# Patient Record
Sex: Female | Born: 1955 | Race: White | Hispanic: No | Marital: Single | Smoking: Never smoker
Health system: Southern US, Community
[De-identification: ages and names within clinical notes are randomized; demographics above are authoritative.]

## PROBLEM LIST (undated history)

## (undated) DIAGNOSIS — F419 Anxiety disorder, unspecified: Secondary | ICD-10-CM

## (undated) DIAGNOSIS — R7303 Prediabetes: Secondary | ICD-10-CM

## (undated) DIAGNOSIS — Z973 Presence of spectacles and contact lenses: Secondary | ICD-10-CM

## (undated) DIAGNOSIS — M1990 Unspecified osteoarthritis, unspecified site: Secondary | ICD-10-CM

## (undated) DIAGNOSIS — I10 Essential (primary) hypertension: Secondary | ICD-10-CM

## (undated) DIAGNOSIS — D649 Anemia, unspecified: Secondary | ICD-10-CM

## (undated) DIAGNOSIS — T7840XA Allergy, unspecified, initial encounter: Secondary | ICD-10-CM

## (undated) DIAGNOSIS — N200 Calculus of kidney: Secondary | ICD-10-CM

## (undated) DIAGNOSIS — Z87442 Personal history of urinary calculi: Secondary | ICD-10-CM

## (undated) DIAGNOSIS — Z862 Personal history of diseases of the blood and blood-forming organs and certain disorders involving the immune mechanism: Secondary | ICD-10-CM

## (undated) DIAGNOSIS — N390 Urinary tract infection, site not specified: Secondary | ICD-10-CM

## (undated) DIAGNOSIS — K529 Noninfective gastroenteritis and colitis, unspecified: Secondary | ICD-10-CM

## (undated) DIAGNOSIS — E785 Hyperlipidemia, unspecified: Secondary | ICD-10-CM

## (undated) HISTORY — DX: Allergy, unspecified, initial encounter: T78.40XA

## (undated) HISTORY — DX: Anxiety disorder, unspecified: F41.9

## (undated) HISTORY — DX: Essential (primary) hypertension: I10

## (undated) HISTORY — DX: Noninfective gastroenteritis and colitis, unspecified: K52.9

## (undated) HISTORY — DX: Anemia, unspecified: D64.9

## (undated) HISTORY — PX: TUBAL LIGATION: SHX77

## (undated) HISTORY — DX: Urinary tract infection, site not specified: N39.0

## (undated) HISTORY — DX: Unspecified osteoarthritis, unspecified site: M19.90

## (undated) HISTORY — PX: COLONOSCOPY: SHX174

## (undated) HISTORY — DX: Calculus of kidney: N20.0

---

## 2005-03-07 DIAGNOSIS — K5010 Crohn's disease of large intestine without complications: Secondary | ICD-10-CM

## 2005-03-07 HISTORY — DX: Crohn's disease of large intestine without complications: K50.10

## 2009-03-30 ENCOUNTER — Ambulatory Visit (HOSPITAL_COMMUNITY): Admission: RE | Admit: 2009-03-30 | Discharge: 2009-03-30 | Payer: Self-pay | Admitting: Urology

## 2011-06-18 ENCOUNTER — Ambulatory Visit: Payer: Self-pay | Admitting: Family Medicine

## 2011-06-18 VITALS — BP 124/80 | HR 67 | Temp 98.9°F | Resp 16 | Ht 64.0 in | Wt 173.6 lb

## 2011-06-18 DIAGNOSIS — M1990 Unspecified osteoarthritis, unspecified site: Secondary | ICD-10-CM

## 2011-06-18 DIAGNOSIS — M159 Polyosteoarthritis, unspecified: Secondary | ICD-10-CM

## 2011-06-18 DIAGNOSIS — I10 Essential (primary) hypertension: Secondary | ICD-10-CM

## 2011-06-18 DIAGNOSIS — F411 Generalized anxiety disorder: Secondary | ICD-10-CM

## 2011-06-18 DIAGNOSIS — F419 Anxiety disorder, unspecified: Secondary | ICD-10-CM

## 2011-06-18 LAB — BASIC METABOLIC PANEL
CO2: 27 mEq/L (ref 19–32)
Glucose, Bld: 103 mg/dL — ABNORMAL HIGH (ref 70–99)
Potassium: 3.9 mEq/L (ref 3.5–5.3)
Sodium: 138 mEq/L (ref 135–145)

## 2011-06-18 MED ORDER — LISINOPRIL 20 MG PO TABS: 20.0000 mg | ORAL_TABLET | Freq: Every day | ORAL | Status: DC

## 2011-06-18 MED ORDER — ATENOLOL 50 MG PO TABS: 50.0000 mg | ORAL_TABLET | Freq: Every day | ORAL | Status: DC

## 2011-06-18 MED ORDER — DIAZEPAM 10 MG PO TABS: 10.0000 mg | ORAL_TABLET | Freq: Four times a day (QID) | ORAL | Status: DC | PRN

## 2011-06-18 MED ORDER — TRAMADOL HCL 50 MG PO TABS: 50.0000 mg | ORAL_TABLET | Freq: Three times a day (TID) | ORAL | Status: DC

## 2011-06-18 NOTE — Progress Notes (Signed)
  Subjective:    Patient ID: Julie Lee, female    DOB: 1955/12/29, 55 y.o.   MRN: [id number]  HPI 56 yo female with HTN and anxiety here for refills. 1) HTN - on atenolol and lisinopril.  Doing well.  Last labs February 2012.  2) anxiety - on diazepam.  Uses a few times a week.   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  IN hands, knees, back.  Tramadol helps immensely.  2 in AM and 1 in PM.    Review of Systems Negative except as per HPI     Objective:   Physical Exam  Constitutional: She appears well-developed and well-nourished.  Cardiovascular: Normal rate, regular rhythm, normal heart sounds and intact distal pulses.   No murmur heard. Pulmonary/Chest: Effort normal and breath sounds normal.  Neurological: She is alert.  Skin: Skin is warm and dry.          Assessment & Plan:  HTN - 1 year supply of meds.  Check BMET here today. Anxiety - refilled valium Arthritis - tramadol refilled.

## 2011-06-19 ENCOUNTER — Encounter: Payer: Self-pay | Admitting: *Deleted

## 2012-01-25 ENCOUNTER — Ambulatory Visit: Payer: Self-pay | Admitting: Family Medicine

## 2012-01-25 VITALS — BP 118/80 | HR 56 | Temp 97.7°F | Resp 16 | Ht 63.5 in | Wt 169.0 lb

## 2012-01-25 DIAGNOSIS — F411 Generalized anxiety disorder: Secondary | ICD-10-CM

## 2012-01-25 DIAGNOSIS — M1990 Unspecified osteoarthritis, unspecified site: Secondary | ICD-10-CM

## 2012-01-25 DIAGNOSIS — I10 Essential (primary) hypertension: Secondary | ICD-10-CM

## 2012-01-25 DIAGNOSIS — F419 Anxiety disorder, unspecified: Secondary | ICD-10-CM

## 2012-01-25 MED ORDER — DIAZEPAM 10 MG PO TABS: 10.0000 mg | ORAL_TABLET | Freq: Two times a day (BID) | ORAL | Status: DC | PRN

## 2012-01-25 MED ORDER — TRAMADOL HCL 50 MG PO TABS: 50.0000 mg | ORAL_TABLET | Freq: Three times a day (TID) | ORAL | Status: DC

## 2012-01-25 MED ORDER — ATENOLOL 50 MG PO TABS: 50.0000 mg | ORAL_TABLET | Freq: Every day | ORAL | Status: DC

## 2012-01-25 MED ORDER — LISINOPRIL 20 MG PO TABS: 20.0000 mg | ORAL_TABLET | Freq: Every day | ORAL | Status: DC

## 2012-01-25 NOTE — Progress Notes (Signed)
[facility] [address], [place] [address] [phone]  Date:  01/25/2012   Name:  Julie Lee   DOB:  Sep 17, 1955   MRN:  [id number]  PCP:  No primary provider on file.    Chief Complaint: Medication Refill   History of Present Illness:  Julie Lee is a 56 y.o. very pleasant female patient who presents with the following:  Here today for a medication refill.   Her BP is well- controlled.   She uses tramadol as needed for OA and aches and pains- it does help.  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  She is very active in her job. Her hands and knees bother her the most.  She takes 2 ultram in the am and 1 in the afternoon  She uses her diazepam for her colitis.  Stress causes her colitis to get worse.  She also uses ibuprofen sometimes for her OA which is upsetting for her stomach.  She takes a 1/2 to a whole pill approx twice a week.  She had more refills from her last rx but they expired after 6 months.    She also notes that her ears feel full and tender.   Declines a flu shot today.   There is no problem list on file for this patient.   Past Medical History  Diagnosis Date  . Arthritis   . Hypertension   . Colitis     Past Surgical History  Procedure Date  . Cesarean section   . Tubal ligation     History  Substance Use Topics  . Smoking status: Never Smoker   . Smokeless tobacco: Never Used  . Alcohol Use: Not on file    History reviewed. No pertinent family history.  No Known Allergies  Medication list has been reviewed and updated.  Current Outpatient Prescriptions on File Prior to Visit  Medication Sig Dispense Refill  . atenolol (TENORMIN) 50 MG tablet Take 1 tablet (50 mg total) by mouth daily.  90 tablet  3  . diazepam (VALIUM) 10 MG tablet Take 1 tablet (10 mg total) by mouth every 6 (six) hours as needed.  30 tablet  5  . lisinopril (PRINIVIL,ZESTRIL) 20 MG tablet Take 1 tablet (20 mg total) by mouth daily.  90 tablet  3    . traMADol (ULTRAM) 50 MG tablet Take 1 tablet (50 mg total) by mouth 3 (three) times daily.  90 tablet  5    Review of Systems:  As per HPI- otherwise negative.   Physical Examination: Filed Vitals:   01/25/12 1519  BP: 118/80  Pulse: 56  Temp: 97.7 F (36.5 C)  Resp: 16   Filed Vitals:   01/25/12 1519  Height: 5' 3.5" (1.613 m)  Weight: 169 lb (76.658 kg)   Body mass index is 29.47 kg/(m^2). Ideal Body Weight: Weight in (lb) to have BMI = 25: 143.1   GEN: WDWN, NAD, Non-toxic, A & O x 3 HEENT: Atraumatic, Normocephalic. Neck supple. No masses, No LAD. Bilateral TM obscured by cerumen, oropharynx normal.  PEERL,EOMI.   Ears and Nose: No external deformity. CV: RRR, No M/G/R. No JVD. No thrill. No extra heart sounds. PULM: CTA B, no wheezes, crackles, rhonchi. No retractions. No resp. distress. No accessory muscle use. EXTR: No c/c/e.  IP joints of both hands clearly show OA changes NEURO Normal gait.  PSYCH: Normally interactive. Conversant. Not depressed or anxious appearing.  Calm demeanor.   Both ears irrigated  with warm water until cerumen removed.  She felt much better and TM normal bilaterally    Assessment and Plan: 1. HTN (hypertension)  atenolol (TENORMIN) 50 MG tablet, lisinopril (PRINIVIL,ZESTRIL) 20 MG tablet  2. OA (osteoarthritis)  traMADol (ULTRAM) 50 MG tablet  3. Anxiety  diazepam (VALIUM) 10 MG tablet   Refilled her medications today as above.  Encouraged her to come in for a CPE when she can- she hopes to do this in the spring. Cerumen impaction resolved.   Meds ordered this encounter  Medications  . atenolol (TENORMIN) 50 MG tablet    Sig: Take 1 tablet (50 mg total) by mouth daily.    Dispense:  90 tablet    Refill:  3  . lisinopril (PRINIVIL,ZESTRIL) 20 MG tablet    Sig: Take 1 tablet (20 mg total) by mouth daily.    Dispense:  90 tablet    Refill:  3  . traMADol (ULTRAM) 50 MG tablet    Sig: Take 1 tablet (50 mg total) by mouth 3  (three) times daily.    Dispense:  90 tablet    Refill:  5  . diazepam (VALIUM) 10 MG tablet    Sig: Take 1 tablet (10 mg total) by mouth every 12 (twelve) hours as needed for anxiety.    Dispense:  30 tablet    Refill:  3     Arthella Headings, MD

## 2012-08-23 ENCOUNTER — Ambulatory Visit (INDEPENDENT_AMBULATORY_CARE_PROVIDER_SITE_OTHER): Payer: BC Managed Care – PPO | Admitting: Family Medicine

## 2012-08-23 VITALS — BP 110/60 | HR 64 | Temp 98.0°F | Resp 18 | Ht 64.5 in | Wt 166.0 lb

## 2012-08-23 DIAGNOSIS — I10 Essential (primary) hypertension: Secondary | ICD-10-CM | POA: Insufficient documentation

## 2012-08-23 DIAGNOSIS — Z23 Encounter for immunization: Secondary | ICD-10-CM

## 2012-08-23 DIAGNOSIS — Z0000 Encounter for general adult medical examination without abnormal findings: Secondary | ICD-10-CM

## 2012-08-23 DIAGNOSIS — M1990 Unspecified osteoarthritis, unspecified site: Secondary | ICD-10-CM | POA: Insufficient documentation

## 2012-08-23 DIAGNOSIS — K5190 Ulcerative colitis, unspecified, without complications: Secondary | ICD-10-CM | POA: Insufficient documentation

## 2012-08-23 LAB — COMPREHENSIVE METABOLIC PANEL
ALT: 19 U/L (ref 0–35)
Albumin: 3.9 g/dL (ref 3.5–5.2)
Alkaline Phosphatase: 75 U/L (ref 39–117)
CO2: 25 mEq/L (ref 19–32)
Glucose, Bld: 90 mg/dL (ref 70–99)
Potassium: 4.1 mEq/L (ref 3.5–5.3)
Sodium: 141 mEq/L (ref 135–145)
Total Protein: 6.6 g/dL (ref 6.0–8.3)

## 2012-08-23 LAB — POCT CBC
Hemoglobin: 12.4 g/dL (ref 12.2–16.2)
Lymph, poc: 2 (ref 0.6–3.4)
MCH, POC: 27.7 pg (ref 27–31.2)
MCHC: 30.6 g/dL — AB (ref 31.8–35.4)
MCV: 90.6 fL (ref 80–97)
MID (cbc): 0.5 (ref 0–0.9)
MPV: 9.4 fL (ref 0–99.8)
POC MID %: 8 %M (ref 0–12)

## 2012-08-23 LAB — LIPID PANEL
Cholesterol: 209 mg/dL — ABNORMAL HIGH (ref 0–200)
HDL: 57 mg/dL (ref >39)
Total CHOL/HDL Ratio: 3.7 Ratio

## 2012-08-23 MED ORDER — TRAMADOL HCL 50 MG PO TABS: 100.0000 mg | ORAL_TABLET | Freq: Two times a day (BID) | ORAL | Status: DC | PRN

## 2012-08-23 MED ORDER — CYCLOBENZAPRINE HCL 10 MG PO TABS: 10.0000 mg | ORAL_TABLET | Freq: Every evening | ORAL | Status: DC | PRN

## 2012-08-23 NOTE — Patient Instructions (Addendum)
Try cutting your atenolol in 1/2- please get in touch with me in the next couple of weeks with an update regarding your blood pressure and how you are feeling I will be in touch regarding your labs We will refer you to GI and also to physical therapy

## 2012-08-23 NOTE — Progress Notes (Signed)
[facility] [address] [place] [address] [phone]  Date:  08/23/2012   Name:  Julie Lee   DOB:  1955-04-13   MRN:  [id number]  PCP:  No primary provider on file.    Chief Complaint: cpe   History of Present Illness:  Julie Lee is a 57 y.o. very pleasant female patient who presents with the following:  She is here today for a CPE.  She continues to have pain with her arthritis, mostly in her hands, feet, right knee and left shoulder.  She uses tramadol as needed- she takes 100 mg BID.  She cannot use NSAIDs due to her colitis.  She has a history of UC and her last colonoscopy was about 10 years ago.   She recently got insurance.  She needs to get established with a GI doctor in [place].    Her last pap was about 8 years ago; she has never had an abnormal pap smear She is also due for a mammogram- she knows how to schedule this.  She did have a FNA in the past, but this turned out to be ok.   She has been menopausal for about 10 years.  She plans to schedule her mammogram at her usual facility and will also inquire about getting a dexa scan.    She ate a small amount this morning, and would like to go ahead and do labs today.  Vanilla wafers with PB.   Her last tetanus shot was about 20 years ago   She is bothered the most by her left shoulder- it has been painful for some time and really hurts at night, sometimes it keeps her awake.  She uses some flexeril last year which was helpful for her.    She uses the valium very rarely and does not need a RF today She is concerned that her BP may be too low, and she sometimes feels dizzy is she stands up too fast.  ?could she stop some of her BP medication. She does not have any other heart problems that she is aware of and does not think there is any particular reason why she is on a beta blocker  There are no active problems to display for this patient.   Past Medical History  Diagnosis Date   . Arthritis   . Hypertension   . Colitis   . Allergy   . Anxiety   . Colitis     Past Surgical History  Procedure Laterality Date  . Cesarean section    . Tubal ligation      History  Substance Use Topics  . Smoking status: Never Smoker   . Smokeless tobacco: Never Used  . Alcohol Use: Not on file    History reviewed. No pertinent family history.  No Known Allergies  Medication list has been reviewed and updated.  Current Outpatient Prescriptions on File Prior to Visit  Medication Sig Dispense Refill  . atenolol (TENORMIN) 50 MG tablet Take 1 tablet (50 mg total) by mouth daily.  90 tablet  3  . diazepam (VALIUM) 10 MG tablet Take 1 tablet (10 mg total) by mouth every 12 (twelve) hours as needed for anxiety.  30 tablet  3  . lisinopril (PRINIVIL,ZESTRIL) 20 MG tablet Take 1 tablet (20 mg total) by mouth daily.  90 tablet  3  . traMADol (ULTRAM) 50 MG tablet Take 1 tablet (50 mg total) by mouth 3 (three) times daily.  90 tablet  5   No current facility-administered medications on file prior to visit.    Review of Systems:  As per HPI- otherwise negative. Reviewed her ROS sheet  Physical Examination: Filed Vitals:   08/23/12 1234  BP: 110/60  Pulse: 64  Temp: 98 F (36.7 C)  Resp: 18   Filed Vitals:   08/23/12 1234  Height: 5' 4.5" (1.638 m)  Weight: 166 lb (75.297 kg)   Body mass index is 28.06 kg/(m^2). Ideal Body Weight: Weight in (lb) to have BMI = 25: 147.6  GEN: WDWN, NAD, Non-toxic, A & O x 3, looks well HEENT: Atraumatic, Normocephalic. Neck supple. No masses, No LAD.  Bilateral TM wnl, oropharynx normal.  PEERL,EOMI.   Ears and Nose: No external deformity. CV: RRR, No M/G/R. No JVD. No thrill. No extra heart sounds. PULM: CTA B, no wheezes, crackles, rhonchi. No retractions. No resp. distress. No accessory muscle use. ABD: S, NT, ND, +BS. No rebound. No HSM. EXTR: No c/c/e.  Evidence of OA of her hands/ DIP joints NEURO Normal gait.  PSYCH:  Normally interactive. Conversant. Not depressed or anxious appearing.  Calm demeanor.  Breast:normal exam, no masses or dimpling GU: normal exam, no adnexal TTP, no discharge, lesions, or CMT Left shoulder: she has tenderness with full flexion, and with internal and external rotation.   She has OA of her hands, especially the DIPs of both hands   Results for orders placed in visit on 08/23/12  POCT CBC      Result Value Range   WBC 6.8  4.6 - 10.2 K/uL   Lymph, poc 2.0  0.6 - 3.4   POC LYMPH PERCENT 29.7  10 - 50 %L   MID (cbc) 0.5  0 - 0.9   POC MID % 8.0  0 - 12 %M   POC Granulocyte 4.2  2 - 6.9   Granulocyte percent 62.3  37 - 80 %G   RBC 4.47  4.04 - 5.48 M/uL   Hemoglobin 12.4  12.2 - 16.2 g/dL   HCT, POC 40.5  37.7 - 47.9 %   MCV 90.6  80 - 97 fL   MCH, POC 27.7  27 - 31.2 pg   MCHC 30.6 (*) 31.8 - 35.4 g/dL   RDW, POC 13.9     Platelet Count, POC 337  142 - 424 K/uL   MPV 9.4  0 - 99.8 fL    EKG: wnl  Assessment and Plan: Physical exam, annual - Plan: POCT CBC, Comprehensive metabolic panel, TSH, Lipid panel, Tdap vaccine greater than or equal to 7yo IM, POCT urinalysis dipstick, POCT UA - Microscopic Only, Pap IG w/ reflex to HPV when ASC-U  Ulcerative colitis, unspecified - Plan: Ambulatory referral to Gastroenterology  Osteoarthritis  HTN (hypertension) - Plan: EKG 12-Lead  OA (osteoarthritis) - Plan: traMADol (ULTRAM) 50 MG tablet, cyclobenzaprine (FLEXERIL) 10 MG tablet, Ambulatory referral to Physical Therapy  Julie Lee is here for a CPE an a few other issues today   See plan above, Will plan further follow- up pending labs. She will cut her atenolol in half and let me know how her BP looks in about 2 weeks  OA pain- referral to PT for her shoulder.  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   Refilled her tramadol- she can take 100 mg BID, and then may try a flexeril at night as needed.  She has used this combination in the past without ill effect.  Cautioned her not to combine the  flexeril with her valium, as this could cause excessive sedation  She will schedule a mammogram and dexa scan  Signed Lamar Blinks, MD

## 2012-08-24 LAB — PAP IG W/ RFLX HPV ASCU

## 2012-08-25 ENCOUNTER — Encounter: Payer: Self-pay | Admitting: Family Medicine

## 2012-10-26 ENCOUNTER — Encounter: Payer: Self-pay | Admitting: Family Medicine

## 2013-01-10 ENCOUNTER — Other Ambulatory Visit: Payer: Self-pay

## 2013-03-15 ENCOUNTER — Other Ambulatory Visit: Payer: Self-pay | Admitting: Family Medicine

## 2013-04-16 ENCOUNTER — Ambulatory Visit (INDEPENDENT_AMBULATORY_CARE_PROVIDER_SITE_OTHER): Payer: BC Managed Care – PPO | Admitting: Emergency Medicine

## 2013-04-16 VITALS — BP 126/78 | HR 84 | Temp 98.1°F | Resp 17 | Ht 64.0 in | Wt 163.0 lb

## 2013-04-16 DIAGNOSIS — F411 Generalized anxiety disorder: Secondary | ICD-10-CM

## 2013-04-16 DIAGNOSIS — M1990 Unspecified osteoarthritis, unspecified site: Secondary | ICD-10-CM

## 2013-04-16 DIAGNOSIS — I10 Essential (primary) hypertension: Secondary | ICD-10-CM

## 2013-04-16 DIAGNOSIS — F419 Anxiety disorder, unspecified: Secondary | ICD-10-CM

## 2013-04-16 DIAGNOSIS — Z760 Encounter for issue of repeat prescription: Secondary | ICD-10-CM

## 2013-04-16 MED ORDER — TRAMADOL HCL 50 MG PO TABS: 100.0000 mg | ORAL_TABLET | Freq: Two times a day (BID) | ORAL | Status: DC | PRN

## 2013-04-16 MED ORDER — CYCLOBENZAPRINE HCL 10 MG PO TABS: 10.0000 mg | ORAL_TABLET | Freq: Every evening | ORAL | Status: DC | PRN

## 2013-04-16 MED ORDER — LISINOPRIL 20 MG PO TABS: ORAL_TABLET | ORAL | Status: DC

## 2013-04-16 MED ORDER — ATENOLOL 50 MG PO TABS: ORAL_TABLET | ORAL | Status: DC

## 2013-04-16 MED ORDER — DIAZEPAM 10 MG PO TABS: 10.0000 mg | ORAL_TABLET | Freq: Two times a day (BID) | ORAL | Status: DC | PRN

## 2013-04-16 NOTE — Progress Notes (Signed)
   Subjective:    Patient ID: Julie Lee, female    DOB: [birth year], 58 y.o.   MRN: [id number]  HPI  58 yo female presents as medication prescriptions are running out.  She is currently taking Valium, atenolol, flexeril, lisinopril and tramadol.  No recent changes to health and no current complaints.  States she is down to last day or two for BP meds.    PPMH:  Osteoarthritis, anxiety, hypertension  SH:  Nonsmoker, no tobacco  Review of Systems  Constitutional: Negative for fever, chills, activity change and appetite change.  Respiratory: Negative for cough, chest tightness and shortness of breath.   Cardiovascular: Negative for chest pain, palpitations and leg swelling.  Gastrointestinal: Negative for nausea, vomiting, abdominal pain, diarrhea and constipation.  Musculoskeletal: Positive for arthralgias. Negative for back pain.  Neurological: Negative for light-headedness and numbness.       Objective:   Physical Exam Blood pressure 126/78, pulse 84, temperature 98.1 F (36.7 C), temperature source Oral, resp. rate 17, height 5' 4"  (1.626 m), weight 163 lb (73.936 kg), SpO2 94.00%. Body mass index is 27.97 kg/(m^2). Well-developed, well nourished female who is awake, alert and oriented, in NAD. HEENT: [place]/AT, PERRL, EOMI.  Sclera and conjunctiva are clear.  Neck: supple, non-tender Heart: RRR, no murmur Lungs: normal effort, CTA Abdomen: normo-active bowel sounds, supple, non-tender, no mass or organomegaly. Extremities: no cyanosis, clubbing or edema. Skin: warm and dry without rash. Psychologic: good mood and appropriate affect, normal speech and behavior.        Assessment & Plan:  Medication refill.  Patient given a prescription for one month of her medications.  She will schedule appointment with Dr. Edilia Bo for further prescriptions after that time.

## 2013-04-19 ENCOUNTER — Other Ambulatory Visit: Payer: Self-pay

## 2013-04-19 DIAGNOSIS — I10 Essential (primary) hypertension: Secondary | ICD-10-CM

## 2013-04-19 MED ORDER — ATENOLOL 50 MG PO TABS: ORAL_TABLET | ORAL | Status: DC

## 2013-05-15 ENCOUNTER — Ambulatory Visit (INDEPENDENT_AMBULATORY_CARE_PROVIDER_SITE_OTHER): Payer: BC Managed Care – PPO | Admitting: Family Medicine

## 2013-05-15 ENCOUNTER — Encounter: Payer: Self-pay | Admitting: Gastroenterology

## 2013-05-15 VITALS — BP 120/80 | HR 59 | Temp 98.3°F | Resp 16 | Ht 64.0 in | Wt 165.0 lb

## 2013-05-15 DIAGNOSIS — I10 Essential (primary) hypertension: Secondary | ICD-10-CM

## 2013-05-15 DIAGNOSIS — F419 Anxiety disorder, unspecified: Secondary | ICD-10-CM

## 2013-05-15 DIAGNOSIS — M1990 Unspecified osteoarthritis, unspecified site: Secondary | ICD-10-CM

## 2013-05-15 DIAGNOSIS — R140 Abdominal distension (gaseous): Secondary | ICD-10-CM

## 2013-05-15 DIAGNOSIS — R142 Eructation: Secondary | ICD-10-CM

## 2013-05-15 DIAGNOSIS — R143 Flatulence: Secondary | ICD-10-CM

## 2013-05-15 DIAGNOSIS — R141 Gas pain: Secondary | ICD-10-CM

## 2013-05-15 DIAGNOSIS — Z8041 Family history of malignant neoplasm of ovary: Secondary | ICD-10-CM

## 2013-05-15 LAB — CBC
HEMATOCRIT: 38 % (ref 36.0–46.0)
Hemoglobin: 12.6 g/dL (ref 12.0–15.0)
MCH: 27.5 pg (ref 26.0–34.0)
MCHC: 33.2 g/dL (ref 30.0–36.0)
MCV: 83 fL (ref 78.0–100.0)
PLATELETS: 352 10*3/uL (ref 150–400)
RBC: 4.58 MIL/uL (ref 3.87–5.11)
RDW: 14.2 % (ref 11.5–15.5)
WBC: 6.6 10*3/uL (ref 4.0–10.5)

## 2013-05-15 LAB — POCT UA - MICROSCOPIC ONLY
Bacteria, U Microscopic: NEGATIVE
CRYSTALS, UR, HPF, POC: NEGATIVE
Casts, Ur, LPF, POC: NEGATIVE
EPITHELIAL CELLS, URINE PER MICROSCOPY: NEGATIVE
MUCUS UA: NEGATIVE
RBC, urine, microscopic: NEGATIVE
WBC, UR, HPF, POC: NEGATIVE
YEAST UA: NEGATIVE

## 2013-05-15 LAB — POCT URINALYSIS DIPSTICK
Bilirubin, UA: NEGATIVE
Glucose, UA: NEGATIVE
KETONES UA: NEGATIVE
Leukocytes, UA: NEGATIVE
Nitrite, UA: NEGATIVE
PROTEIN UA: NEGATIVE
SPEC GRAV UA: 1.02
Urobilinogen, UA: 0.2
pH, UA: 7

## 2013-05-15 MED ORDER — LISINOPRIL 20 MG PO TABS: ORAL_TABLET | ORAL | Status: DC

## 2013-05-15 MED ORDER — DIAZEPAM 10 MG PO TABS: 10.0000 mg | ORAL_TABLET | Freq: Two times a day (BID) | ORAL | Status: DC | PRN

## 2013-05-15 MED ORDER — ATENOLOL 50 MG PO TABS: ORAL_TABLET | ORAL | Status: DC

## 2013-05-15 MED ORDER — TRAMADOL HCL 50 MG PO TABS: 100.0000 mg | ORAL_TABLET | Freq: Two times a day (BID) | ORAL | Status: DC | PRN

## 2013-05-15 NOTE — Patient Instructions (Signed)
I will be in touch with your labs asap- if you have any change in your condition in the next few days please give me a call.

## 2013-05-15 NOTE — Progress Notes (Signed)
[facility] [address], [place] [address] [phone]  Date:  05/15/2013   Name:  SHERIDYN CANINO   DOB:  [birth year]   MRN:  [id number]  PCP:  Lamar Blinks, MD    Chief Complaint: Medication Refill   History of Present Illness:  Julie Lee is a 58 y.o. very pleasant female patient who presents with the following:  She needs a refill of her lisinopril, and would like to have a zostavax rx today.  She works [employer].  She does have UC, but has not had a flare in some time.  Her next colonoscopy is next month.   She has noted some "discomfort" in her lower abdomen for about one year.  She does feel bloated, and notes intermittent pressure and pain in her pelvic area.  The pain can radiate into her back.  She may notice this every day- not as bad on her days off from her job.  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    She has an aunt with a history of ovarian cancer.  She is concerned about this.  She has other family history of ovarian cancer, and some breast cancer in her family.   She had a protein shake this am  She takes lisinopril and atenolol for her HTN.  She does not note any pre- syncope  She has been menopausal for about 10 years.  No bleeding.   She has been stable on her doses of tramadol and valium for some time, uses for anxiety and OA  Patient Active Problem List   Diagnosis Date Noted  . HTN (hypertension) 08/23/2012  . OA (osteoarthritis) 08/23/2012  . Ulcerative colitis, unspecified 08/23/2012    Past Medical History  Diagnosis Date  . Arthritis   . Hypertension   . Colitis   . Allergy   . Anxiety   . Colitis     Past Surgical History  Procedure Laterality Date  . Cesarean section    . Tubal ligation      History  Substance Use Topics  . Smoking status: Never Smoker   . Smokeless tobacco:  Never Used  . Alcohol Use: No    History reviewed. No pertinent family history.  No Known Allergies  Medication list has been reviewed and updated.  Current Outpatient Prescriptions on File Prior to Visit  Medication Sig Dispense Refill  . atenolol (TENORMIN) 50 MG tablet Take 1 tablet by mouth daily.  30 tablet  0  . diazepam (VALIUM) 10 MG tablet Take 1 tablet (10 mg total) by mouth every 12 (twelve) hours as needed for anxiety.  30 tablet  0  . lisinopril (PRINIVIL,ZESTRIL) 20 MG tablet PT NEEDS APPT FOR FURTHER REFILLS  30 tablet  0  . traMADol (ULTRAM) 50 MG tablet Take 2 tablets (100 mg total) by mouth 2 (two) times daily as needed.  120 tablet  0  . cyclobenzaprine (FLEXERIL) 10 MG tablet Take 1 tablet (10 mg total) by mouth at bedtime as needed for muscle spasms.  30 tablet  0   No current facility-administered medications on file prior to visit.    Review of Systems:  As per HPI- otherwise negative.   Physical Examination: Filed Vitals:   05/15/13 1104  BP: 120/80  Pulse: 59  Temp: 98.3 F (36.8 C)  Resp: 16  Filed Vitals:   05/15/13 1104  Height: 5' 4"  (1.626 m)  Weight: 165 lb (74.844 kg)   Body mass index is 28.31 kg/(m^2). Ideal Body Weight: Weight in (lb) to have BMI = 25: 145.3  GEN: WDWN, NAD, Non-toxic, A & O x 3, mild overweight, looks well HEENT: Atraumatic, Normocephalic. Neck supple. No masses, No LAD.  Bilateral TM wnl, oropharynx normal.  PEERL,EOMI.   Ears and Nose: No external deformity. CV: RRR, No M/G/R. No JVD. No thrill. No extra heart sounds. PULM: CTA B, no wheezes, crackles, rhonchi. No retractions. No resp. distress. No accessory muscle use. ABD: S, NT, ND, +BS. No rebound. No HSM.  Benign exam EXTR: No c/c/e NEURO Normal gait.  PSYCH: Normally interactive. Conversant. Not depressed or anxious appearing.  Calm demeanor.  GU:  Normal exam, no adnexal masses or tenderness, no uterine enlargement,  No CMT, no vaginal discharge or  lesions  Results for orders placed in visit on 05/15/13  POCT UA - MICROSCOPIC ONLY      Result Value Ref Range   WBC, Ur, HPF, POC neg     RBC, urine, microscopic neg     Bacteria, U Microscopic neg     Mucus, UA neg     Epithelial cells, urine per micros neg     Crystals, Ur, HPF, POC neg     Casts, Ur, LPF, POC neg     Yeast, UA neg    POCT URINALYSIS DIPSTICK      Result Value Ref Range   Color, UA yellow     Clarity, UA clear     Glucose, UA neg     Bilirubin, UA neg     Ketones, UA neg     Spec Grav, UA 1.020     Blood, UA small     pH, UA 7.0     Protein, UA neg     Urobilinogen, UA 0.2     Nitrite, UA neg     Leukocytes, UA Negative      Assessment and Plan: Essential hypertension, benign - Plan: Comprehensive metabolic panel, lisinopril (PRINIVIL,ZESTRIL) 20 MG tablet, atenolol (TENORMIN) 50 MG tablet  Family history of ovarian cancer - Plan: CA 125, US Pelvis Complete  Abdominal bloating - Plan: CBC, Comprehensive metabolic panel, TSH, POCT UA - Microscopic Only, POCT urinalysis dipstick, US Pelvis Complete  OA (osteoarthritis) - Plan: traMADol (ULTRAM) 50 MG tablet  Anxiety - Plan: diazepam (VALIUM) 10 MG tablet  Angelynn is concerned about the possibility of ovarian cancer.  Will check a Ca-125 and plan for a pelvic ultrasound BP is controlled, continue medications Refilled her tramadol and valium as well Please call pt with results- her computer is down. Also question her further about "pink urine.'  Looks ok today but if frank blood will need to se urology  Signed Lamar Blinks, MD

## 2013-05-16 ENCOUNTER — Other Ambulatory Visit: Payer: Self-pay | Admitting: Family Medicine

## 2013-05-16 DIAGNOSIS — Z8041 Family history of malignant neoplasm of ovary: Secondary | ICD-10-CM

## 2013-05-16 DIAGNOSIS — R140 Abdominal distension (gaseous): Secondary | ICD-10-CM

## 2013-05-16 LAB — COMPREHENSIVE METABOLIC PANEL
ALBUMIN: 4.2 g/dL (ref 3.5–5.2)
ALT: 21 U/L (ref 0–35)
AST: 25 U/L (ref 0–37)
Alkaline Phosphatase: 66 U/L (ref 39–117)
BILIRUBIN TOTAL: 0.3 mg/dL (ref 0.2–1.2)
BUN: 12 mg/dL (ref 6–23)
CALCIUM: 9.4 mg/dL (ref 8.4–10.5)
CHLORIDE: 106 meq/L (ref 96–112)
CO2: 27 meq/L (ref 19–32)
Creat: 0.68 mg/dL (ref 0.50–1.10)
GLUCOSE: 90 mg/dL (ref 70–99)
Potassium: 4.7 mEq/L (ref 3.5–5.3)
SODIUM: 140 meq/L (ref 135–145)
TOTAL PROTEIN: 6.6 g/dL (ref 6.0–8.3)

## 2013-05-16 LAB — CA 125: CA 125: 6.8 U/mL (ref 0.0–30.2)

## 2013-05-16 LAB — TSH: TSH: 0.91 u[IU]/mL (ref 0.350–4.500)

## 2013-05-17 ENCOUNTER — Telehealth: Payer: Self-pay

## 2013-05-17 NOTE — Telephone Encounter (Signed)
PT WANTED TO GET IN TOUCH WITH DR. Edilia Bo REGARDING HER BLOOD TEST RESULTS

## 2013-05-19 ENCOUNTER — Telehealth: Payer: Self-pay

## 2013-05-19 DIAGNOSIS — Z780 Asymptomatic menopausal state: Secondary | ICD-10-CM

## 2013-05-19 NOTE — Telephone Encounter (Signed)
Pt notified of labs. She doesn't have access to MyChart. Pt is scheduled for her U/S tomorrow, her mammogram and her bone density, but she needs an order for her bone density sent to [facility] please. Thanks.

## 2013-05-19 NOTE — Telephone Encounter (Signed)
THIS IS PATIENTS 2ND CALL ABOUT HER LAB RESULTS PLEASE CALL HER AT [phone]

## 2013-05-19 NOTE — Telephone Encounter (Signed)
Placed order in [place] and also will fax to [facility].

## 2013-05-20 ENCOUNTER — Ambulatory Visit
Admission: RE | Admit: 2013-05-20 | Discharge: 2013-05-20 | Disposition: A | Discharge status: Home / Self Care | Payer: BC Managed Care – PPO | Source: Ambulatory Visit | Attending: Family Medicine | Admitting: Family Medicine

## 2013-05-20 ENCOUNTER — Encounter: Payer: Self-pay | Admitting: Family Medicine

## 2013-05-20 ENCOUNTER — Telehealth: Payer: Self-pay | Admitting: Family Medicine

## 2013-05-20 DIAGNOSIS — Z8041 Family history of malignant neoplasm of ovary: Secondary | ICD-10-CM

## 2013-05-20 DIAGNOSIS — R319 Hematuria, unspecified: Secondary | ICD-10-CM

## 2013-05-20 DIAGNOSIS — R140 Abdominal distension (gaseous): Secondary | ICD-10-CM

## 2013-05-20 NOTE — Telephone Encounter (Signed)
Sent to the wrong pool.

## 2013-05-20 NOTE — Telephone Encounter (Signed)
Order has been sent, she is good to be seen according to [facility].

## 2013-05-20 NOTE — Telephone Encounter (Signed)
Called to let her know that her ultrasound is normal.  Between this and her negative Ca-125 we are no longer as concerned about ovarian cancer.  She is pleased but still concerned about her sx.  She also admits that she has noted a pink color of her urine, both with wiping and in the toilet bowel.  Will refer to urology next for further evaluation.

## 2013-05-20 NOTE — Telephone Encounter (Signed)
Left message on VM to call back

## 2013-05-20 NOTE — Telephone Encounter (Signed)
Spoke to pt today, she is aware of lab results. She also need to have a bone density referral made bc she has an appointment today at 2:00 pm. Please advise.

## 2013-05-27 ENCOUNTER — Encounter: Payer: Self-pay | Admitting: Family Medicine

## 2013-05-31 ENCOUNTER — Telehealth: Payer: Self-pay

## 2013-05-31 NOTE — Telephone Encounter (Signed)
Pt states that she recently had a bone density test done , the results concluded that she did have low bone mass but states that she does not have osteoporosis. Pt is confused because she thought that osteoporosis is associated with low bone mass. Best# [phone]

## 2013-06-01 ENCOUNTER — Encounter: Payer: Self-pay | Admitting: Family Medicine

## 2013-06-26 ENCOUNTER — Encounter: Payer: Self-pay | Admitting: Gastroenterology

## 2013-06-26 ENCOUNTER — Ambulatory Visit (INDEPENDENT_AMBULATORY_CARE_PROVIDER_SITE_OTHER): Payer: BC Managed Care – PPO | Admitting: Gastroenterology

## 2013-06-26 VITALS — BP 138/80 | HR 56 | Ht 64.0 in | Wt 169.0 lb

## 2013-06-26 DIAGNOSIS — R1314 Dysphagia, pharyngoesophageal phase: Secondary | ICD-10-CM

## 2013-06-26 DIAGNOSIS — K5190 Ulcerative colitis, unspecified, without complications: Secondary | ICD-10-CM

## 2013-06-26 MED ORDER — MOVIPREP 100 G PO SOLR: 1.0000 | Freq: Once | ORAL | Status: DC

## 2013-06-26 NOTE — Patient Instructions (Addendum)
We will get records sent from your previous gastroenterologist for review ([facility]).  This will include any endoscopic (colonoscopy or upper endoscopy) procedures and any associated pathology reports.   You will be set up for a colonoscopy for abd pain, diarrhea. You will be set up for an upper endoscopy for dysphagia.

## 2013-06-26 NOTE — Progress Notes (Signed)
HPI: This is a    very pleasant 58 year old woman whom I am meeting for the first time today.  Cbc, cmet, tsh 05/2013 were all normal  She was told she had colitis by GI doc about 10 years in [place], no polyps.  She was treated with mesalamine product.  She was having diarrhea, bleeding.  She has been getting colazol from PCP.  She takes it about every couple months for pain in left side and diarrhea.  She is seeing a urologist next monht for hematuria.  She's been having 2 months of lower abd pains.  She's also noticed increase frequency of BMs, 3-4 times per day.  No bleeding.  Even nocturnal symptoms.  She took colazol briefly.  Overall stable weight.  Also having swallowing difficulty, dysphagia solids. Happens with every meal.  Will regurgitate it.  Rarely liquids.   Occurs 4-5 times per week at least.  For 5 years.  A friend of her suggested PPI and it made no difference.  No pyrosis.  Review of systems: Pertinent positive and negative review of systems were noted in the above HPI section. Complete review of systems was performed and was otherwise normal.    Past Medical History  Diagnosis Date  . Arthritis   . Hypertension   . Allergy   . Anxiety   . Colitis   . Kidney stones   . UTI (lower urinary tract infection)     Past Surgical History  Procedure Laterality Date  . Cesarean section    . Tubal ligation      Current Outpatient Prescriptions  Medication Sig Dispense Refill  . atenolol (TENORMIN) 50 MG tablet Take 1 tablet by mouth daily.  90 tablet  3  . diazepam (VALIUM) 10 MG tablet Take 1 tablet (10 mg total) by mouth every 12 (twelve) hours as needed for anxiety.  30 tablet  3  . lisinopril (PRINIVIL,ZESTRIL) 20 MG tablet 1 po daily for high blood pressure  90 tablet  3  . traMADol (ULTRAM) 50 MG tablet Take 2 tablets (100 mg total) by mouth 2 (two) times daily as needed.  120 tablet  3   No current facility-administered medications for this visit.     Allergies as of 06/26/2013  . (No Known Allergies)    Family History  Problem Relation Age of Onset  . Colon cancer Maternal Uncle 49  . Colitis Brother     had colectomy  . Diabetes Paternal Uncle   . Diabetes Paternal Grandfather   . Kidney disease Paternal Uncle   . Heart disease Father   . Heart disease Paternal Grandfather   . Heart disease Maternal Grandfather     History   Social History  . Marital Status: Single    Spouse Name: N/A    Number of Children: N/A  . Years of Education: N/A   Occupational History  . Not on file.   Social History Main Topics  . Smoking status: Never Smoker   . Smokeless tobacco: Never Used  . Alcohol Use: Yes     Comment: rarely  . Drug Use: No  . Sexual Activity: No   Other Topics Concern  . Not on file   Social History Narrative  . No narrative on file       Physical Exam: BP 138/80  Pulse 56  Ht 5' 4"  (1.626 m)  Wt 169 lb (76.658 kg)  BMI 28.99 kg/m2 Constitutional: generally well-appearing Psychiatric: alert and oriented x3 Eyes: extraocular movements  intact Mouth: oral pharynx moist, no lesions Neck: supple no lymphadenopathy Cardiovascular: heart regular rate and rhythm Lungs: clear to auscultation bilaterally Abdomen: soft, nontender, nondistended, no obvious ascites, no peritoneal signs, normal bowel sounds Extremities: no lower extremity edema bilaterally Skin: no lesions on visible extremities    Assessment and plan: 58 y.o. female with  history of some type of colitis but does sound like she has ulcerative colitis, five-year history of intermittent dysphasia  We'll try to get records from her previous gastroenterologist for review. It does sound like she probably had and may still have ulcerative colitis. This could account for some of her lower abdominal symptoms, diarrhea flares periodically. She is only taking mesalamine provided by her primary care physician on an as-needed basis about every 2  or 3 months. Her last colonoscopy was over 10 years ago and now like to repeat that now for restaging of her disease, also screening for colon cancer. With her intermittent dysphasia like to at the same time proceed with EGD, dilation if needed. She has tried antiacid medicines in the past including proton pump inhibitors for the dysphasia and they did not help. She has no heartburn symptoms.

## 2013-07-02 ENCOUNTER — Telehealth: Payer: Self-pay | Admitting: Gastroenterology

## 2013-07-02 MED ORDER — MESALAMINE 1.2 G PO TBEC: 2.4000 g | DELAYED_RELEASE_TABLET | Freq: Two times a day (BID) | ORAL | Status: DC

## 2013-07-02 NOTE — Telephone Encounter (Signed)
Colonoscopy, June 2007, Dr. Richardson Landry fine, done for diarrhea, rectal bleeding, occult GI bleeding. Findings diverticulosis of the sigmoid, ulcers in the transverse colon descending colon and cecum which were described as multiple shallow nonbleeding ulcers ranging in size from 2-4 millimeters associated with mucosal friability, normal terminal ileum, internal hemorrhoids. Biopsies suggested that she had mild, chronic, active colitis this was in transverse, cecum and descending segments Upper endoscopy, June 2007, Dr. Richardson Landry fine, done for abdominal pain, nausea vomiting, dysphagia, diarrhea. Findings scattered erosions in the antrum, normal otherwise. She was recommended to stop diclofenac. 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 Biopsies of the duodenum were normal.   Please call her, I reviewed her previous records from [place]. It does appear that she had pretty well documented colitis that is likely ulcerative colitis. I would like her to start lialda 2 pills twice daily.  Please prescribed one month with 11 refills.  We will discuss further at her upcoming colonoscopy, upper endoscopy appointment.   Her upper endoscopy biopsies also suggested that C. she had a process of eosinophilic esophagitis. I am interested to see how her esophagus looks are upcoming upper endoscopy and will repeat biopsies.

## 2013-07-02 NOTE — Telephone Encounter (Signed)
Pt aware and lialda has bee sent to the pharmacy

## 2013-07-03 ENCOUNTER — Encounter: Payer: Self-pay | Admitting: Gastroenterology

## 2013-07-17 ENCOUNTER — Encounter: Payer: Self-pay | Admitting: Family Medicine

## 2013-08-09 ENCOUNTER — Ambulatory Visit (AMBULATORY_SURGERY_CENTER): Payer: BC Managed Care – PPO | Admitting: Gastroenterology

## 2013-08-09 ENCOUNTER — Telehealth: Payer: Self-pay | Admitting: Gastroenterology

## 2013-08-09 ENCOUNTER — Encounter: Payer: Self-pay | Admitting: Gastroenterology

## 2013-08-09 VITALS — BP 126/76 | HR 67 | Temp 97.8°F | Resp 19 | Ht 64.0 in | Wt 169.0 lb

## 2013-08-09 DIAGNOSIS — K5190 Ulcerative colitis, unspecified, without complications: Secondary | ICD-10-CM

## 2013-08-09 DIAGNOSIS — K5289 Other specified noninfective gastroenteritis and colitis: Secondary | ICD-10-CM

## 2013-08-09 DIAGNOSIS — R1314 Dysphagia, pharyngoesophageal phase: Secondary | ICD-10-CM

## 2013-08-09 DIAGNOSIS — R933 Abnormal findings on diagnostic imaging of other parts of digestive tract: Secondary | ICD-10-CM

## 2013-08-09 DIAGNOSIS — K209 Esophagitis, unspecified without bleeding: Secondary | ICD-10-CM

## 2013-08-09 MED ORDER — SODIUM CHLORIDE 0.9 % IV SOLN: 500.0000 mL | INTRAVENOUS | Status: DC

## 2013-08-09 NOTE — Op Note (Signed)
[place]  [address] [place], [id number]   ENDOSCOPY PROCEDURE REPORT  PATIENT: Julie Lee, Julie Lee  MR#: [id number] BIRTHDATE: 1955-05-22 , 58  yrs. old GENDER: Female ENDOSCOPIST: Milus Banister, MD PROCEDURE DATE:  08/09/2013 PROCEDURE:  EGD w/ biopsy ASA CLASS:     Class II INDICATIONS:  Upper endoscopy, June 2007, Dr.  Richardson Landry fine, done for abdominal pain, nausea vomiting, dysphagia, diarrhea.  Findings scattered erosions in the antrum, normal otherwise.  She was recommended to stop diclofenac.  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  Biopsies of the duodenum were normal.  Her symptoms seemed to improve on PPI; Now with recurrent dysphagia MEDICATIONS: MAC sedation, administered by CRNA and propofol (Diprivan) 115m IV TOPICAL ANESTHETIC: none  DESCRIPTION OF PROCEDURE: After the risks benefits and alternatives of the procedure were thoroughly explained, informed consent was obtained.  The LB [id number] was introduced through the mouth and advanced to the second portion of the duodenum. Without limitations.  The instrument was slowly withdrawn as the mucosa was fully examined.     The upper, middle and distal third of the esophagus were carefully inspected and no abnormalities were noted.  The z-line was well seen at the GEJ.  The endoscope was pushed into the fundus which was normal including a retroflexed view.  The antrum, gastric body, first and second part of the duodenum were unremarkable. Retroflexed views revealed no abnormalities.   The esophagus was biopsied (distal and proximal specimine sent in separate jars). The scope was then withdrawn from the patient and the procedure completed.  COMPLICATIONS: There were no complications. ENDOSCOPIC IMPRESSION: Normal EGD, biopsies taken to check for Eosinophilic Esophagitis  RECOMMENDATIONS: Await biopsy results   eSigned:  DMilus Banister MD 08/09/2013 10:43 AM   CC: JLamar Blinks MD

## 2013-08-09 NOTE — Progress Notes (Signed)
A/ox3, pleased with MAC, report to RN 

## 2013-08-09 NOTE — Progress Notes (Signed)
Pt saw some blood on pad and wipes when getting dressed, blood was very small amount smear on pad and pink tinged on wipes, advised pt she could see some blood today but to call us if bleeding gets worse, pt states understanding-adm

## 2013-08-09 NOTE — Progress Notes (Signed)
Called to room to assist during endoscopic procedure.  Patient ID and intended procedure confirmed with present staff. Received instructions for my participation in the procedure from the performing physician.  

## 2013-08-09 NOTE — Op Note (Signed)
[place]  [address]. [place], [address]   COLONOSCOPY PROCEDURE REPORT  PATIENT: Julie Lee, Julie Lee  MR#: [id number] BIRTHDATE: 12/25/1955 , 58  yrs. old GENDER: Female ENDOSCOPIST: Milus Banister, MD REFERRED AS:[id number] Copland, M.D. PROCEDURE DATE:  08/09/2013 PROCEDURE:   Colonoscopy with biopsy First Screening Colonoscopy - Avg.  risk and is 50 yrs.  old or older - No.  Prior Negative Screening - Now for repeat screening. N/A  History of Adenoma - Now for follow-up colonoscopy & has been > or = to 3 yrs.  N/A  Polyps Removed Today? No.  Recommend repeat exam, <10 yrs? No. ASA CLASS:   Class II INDICATIONS:Colonoscopy, June 2007, Dr.  Richardson Landry fine, done for diarrhea, rectal bleeding, occult GI bleeding.  Findings diverticulosis of the sigmoid, ulcers in the transverse colon descending colon and cecum which were described as multiple shallow nonbleeding ulcers ranging in size from 2-4 millimeters associated with mucosal friability, normal terminal ileum, internal hemorrhoids.  Biopsies suggested that she had mild, chronic, active colitis this was in transverse, cecum and descending segments. Now with intermittent diarrhea, abdominal pains. MEDICATIONS: MAC sedation, administered by CRNA and propofol (Diprivan) 320m IV  DESCRIPTION OF PROCEDURE:   After the risks benefits and alternatives of the procedure were thoroughly explained, informed consent was obtained.  A digital rectal exam revealed no abnormalities of the rectum.   The LB [id number] endoscope was introduced through the anus and advanced to the terminal ileum which was intubated for a short distance. No adverse events experienced.   The quality of the prep was good.  The instrument was then slowly withdrawn as the colon was fully examined.  COLON FINDINGS: The mucosa appeared normal in the terminal ileum. There was mild erythema, ? inflammation in ascending segment.  The examination  was otherwise normal.  Segmental biopsies taken (4 separate path jars) and sent to pathology.  Retroflexed views revealed no abnormalities. The time to cecum=2 minutes 21 seconds. Withdrawal time=8 minutes 03 seconds.  The scope was withdrawn and the procedure completed. COMPLICATIONS: There were no complications.  ENDOSCOPIC IMPRESSION: 1.   Normal mucosa in the terminal ileum 2.  There was mild erythema, ? inflammation in ascending segment. The examination was otherwise normal.  Segmental biopsies taken (4 separate path jars) and sent to pathology.  RECOMMENDATIONS: Await final pathology.  For now, continue lialda 2 pills twice daily.   eSigned:  DMilus Banister MD 08/09/2013 10:35 AM    PATIENT NAME:  Julie Lee, MaidenMR#: [id number]

## 2013-08-09 NOTE — Patient Instructions (Signed)
YOU HAD AN ENDOSCOPIC PROCEDURE TODAY AT [facility]: Refer to the procedure report that was given to you for any specific questions about what was found during the examination.  If the procedure report does not answer your questions, please call your gastroenterologist to clarify.  If you requested that your care partner not be given the details of your procedure findings, then the procedure report has been included in a sealed envelope for you to review at your convenience later.  YOU SHOULD EXPECT: Some feelings of bloating in the abdomen. Passage of more gas than usual.  Walking can help get rid of the air that was put into your GI tract during the procedure and reduce the bloating. If you had a lower endoscopy (such as a colonoscopy or flexible sigmoidoscopy) you may notice spotting of blood in your stool or on the toilet paper. If you underwent a bowel prep for your procedure, then you may not have a normal bowel movement for a few days.  DIET: Your first meal following the procedure should be a light meal and then it is ok to progress to your normal diet.  A half-sandwich or bowl of soup is an example of a good first meal.  Heavy or fried foods are harder to digest and may make you feel nauseous or bloated.  Likewise meals heavy in dairy and vegetables can cause extra gas to form and this can also increase the bloating.  Drink plenty of fluids but you should avoid alcoholic beverages for 24 hours.  ACTIVITY: Your care partner should take you home directly after the procedure.  You should plan to take it easy, moving slowly for the rest of the day.  You can resume normal activity the day after the procedure however you should NOT DRIVE or use heavy machinery for 24 hours (because of the sedation medicines used during the test).    SYMPTOMS TO REPORT IMMEDIATELY: A gastroenterologist can be reached at any hour.  During normal business hours, 8:30 AM to 5:00 PM Monday through Friday,  call [phone].  After hours and on weekends, please call the GI answering service at [phone] who will take a message and have the physician on call contact you.   Following lower endoscopy (colonoscopy or flexible sigmoidoscopy):  Excessive amounts of blood in the stool  Significant tenderness or worsening of abdominal pains  Swelling of the abdomen that is new, acute  Fever of 100F or higher  Following upper endoscopy (EGD)  Vomiting of blood or coffee ground material  New chest pain or pain under the shoulder blades  Painful or persistently difficult swallowing  New shortness of breath  Fever of 100F or higher  Black, tarry-looking stools  FOLLOW UP: If any biopsies were taken you will be contacted by phone or by letter within the next 1-3 weeks.  Call your gastroenterologist if you have not heard about the biopsies in 3 weeks.  Our staff will call the home number listed on your records the next business day following your procedure to check on you and address any questions or concerns that you may have at that time regarding the information given to you following your procedure. This is a courtesy call and so if there is no answer at the home number and we have not heard from you through the emergency physician on call, we will assume that you have returned to your regular daily activities without incident.  SIGNATURES/CONFIDENTIALITY: You and/or your care  partner have signed paperwork which will be entered into your electronic medical record.  These signatures attest to the fact that that the information above on your After Visit Summary has been reviewed and is understood.  Full responsibility of the confidentiality of this discharge information lies with you and/or your care-partner.  Take 2 liada twice a day.  Wait biopsy results.  Esophagitis handout give.

## 2013-08-09 NOTE — Telephone Encounter (Signed)
Returned phone call to the pt.  She said she had a small amount of rectal bleeding before she left [facility] today.  Through the afternoon she has passed clots from the rectum the circumference of a quarter and flat, then the size of a flat golf ball.  She asked if she should hold ASA 81 mg and omega 3 krill oil tonight.  I asked Dr. Ardis Hughs what he recommended.  He said that it is unlikely a serious bleed.  That the mucosal bx  was rarely a problem.  He expects the bleeding to lessen and lessen but if the bleeding continues and if it worsens to go to the ED.  I called the pt back and gave her Dr. Eugenia Pancoast message.  She said the clots were lessening and felt the bleeding was better.  I also advised the pt per Dr. Ardis Hughs to hold ASA 81 mg for 4-5 nights but could continue taking omega 3 krill oil.  I also advised the pt to take it easy the rest of the night.  Pt states she understands.  I advised her that Dr. Delfin Edis was our on call md this pm and reminded her the answering service number on her discharge instructions.  Pt thanked me for the advice. maw

## 2013-08-12 ENCOUNTER — Telehealth: Payer: Self-pay

## 2013-08-12 NOTE — Telephone Encounter (Signed)
  Follow up Call-  Call back number 08/09/2013  Post procedure Call Back phone  # [phone]  Permission to leave phone message Yes     Patient questions:  Do you have a fever, pain , or abdominal swelling? no Pain Score  0 *  Have you tolerated food without any problems? yes  Have you been able to return to your normal activities? yes  Do you have any questions about your discharge instructions: Diet   no Medications  no Follow up visit  no  Do you have questions or concerns about your Care? no  Actions: * If pain score is 4 or above: No action needed, pain <4.  The pt called back Friday afternoon and c/o passing clots of blood from her rectum.  She said she did pass a few more clots Friday night but sat. Am she had a normal bowel movement and no bleeding noted.  She did not see any more rectal bleeding.  No other problems noted. Maw

## 2013-08-16 ENCOUNTER — Other Ambulatory Visit: Payer: Self-pay

## 2013-08-16 MED ORDER — FLUTICASONE PROPIONATE HFA 220 MCG/ACT IN AERO: INHALATION_SPRAY | RESPIRATORY_TRACT | Status: DC

## 2013-09-02 ENCOUNTER — Telehealth: Payer: Self-pay | Admitting: Gastroenterology

## 2013-09-04 MED ORDER — PREDNISONE 5 MG PO TABS: 5.0000 mg | ORAL_TABLET | Freq: Every day | ORAL | Status: DC

## 2013-09-04 NOTE — Telephone Encounter (Signed)
The pt says she has not picked up her lialda or fluticasone because she can not afford $50 copay I am looking into patient assistance for her.  She want to know if it is anything else she can try?

## 2013-09-04 NOTE — Telephone Encounter (Signed)
$  89 co pay is actually pretty good for both of those medicines.    Can try prednisone in place of the fluticasone.  Prednisone 23m pills, take one pill every day, disp one month with 3 refills. Should have rov in 2 months.  AS far as UC meds, I am not sure what her insurance company prefers, would continue to look into patient assistance.  Also see what her insurance company thinks about asacol, I am happy to prescribe that instead if it is less out of pocket.

## 2013-09-04 NOTE — Telephone Encounter (Signed)
Prednisone has been sent and the pt will contact her insurance regarding asacol.  She also wants to schedule f/u after she checks her schedule

## 2013-10-16 ENCOUNTER — Other Ambulatory Visit: Payer: Self-pay | Admitting: Family Medicine

## 2013-10-16 ENCOUNTER — Telehealth: Payer: Self-pay

## 2013-10-16 DIAGNOSIS — I10 Essential (primary) hypertension: Secondary | ICD-10-CM

## 2013-10-16 DIAGNOSIS — M8949 Other hypertrophic osteoarthropathy, multiple sites: Secondary | ICD-10-CM

## 2013-10-16 DIAGNOSIS — F419 Anxiety disorder, unspecified: Secondary | ICD-10-CM

## 2013-10-16 DIAGNOSIS — M159 Polyosteoarthritis, unspecified: Secondary | ICD-10-CM

## 2013-10-16 DIAGNOSIS — M150 Primary generalized (osteo)arthritis: Secondary | ICD-10-CM

## 2013-10-16 NOTE — Telephone Encounter (Signed)
PATIENT SEES DR. Lorelei Pont AND WOULD LIKE MEDICATION REFIIL,HOWEVER, SHE CANNOT COME IN TO BE SEEN UNTIL THIS COMING WEEKEND AND DR. COPLAND WILL NOT BE WORKING. PATIENT IS WONDERING IF DR. COPLAND CAN AUTHORIZE ANOTHER CARE GIVER TO WRITE A REFILL FOR HER MED (TRAMIDOL,DIAZATAN AND BP MED) IF SHE COMES IN TO BE SEEN THIS WEEKEND. PLEASE CALL PATIENT.

## 2013-10-17 MED ORDER — TRAMADOL HCL 50 MG PO TABS: 50.0000 mg | ORAL_TABLET | Freq: Two times a day (BID) | ORAL | Status: DC | PRN

## 2013-10-17 MED ORDER — DIAZEPAM 10 MG PO TABS: 10.0000 mg | ORAL_TABLET | Freq: Two times a day (BID) | ORAL | Status: DC | PRN

## 2013-10-17 NOTE — Telephone Encounter (Signed)
Called her- I will just call in an rx for 2 months to hold her until she can come in to be seen.  This was satisfactory to her  Meds ordered this encounter  Medications  . diazepam (VALIUM) 10 MG tablet    Sig: Take 1 tablet (10 mg total) by mouth every 12 (twelve) hours as needed for anxiety.    Dispense:  30 tablet    Refill:  1  . traMADol (ULTRAM) 50 MG tablet    Sig: Take 1-2 tablets (50-100 mg total) by mouth 2 (two) times daily as needed.    Dispense:  120 tablet    Refill:  1

## 2013-10-17 NOTE — Addendum Note (Signed)
Addended by: Lamar Blinks C on: 10/17/2013 10:47 AM   Modules accepted: Orders

## 2013-12-18 ENCOUNTER — Ambulatory Visit (INDEPENDENT_AMBULATORY_CARE_PROVIDER_SITE_OTHER): Payer: BC Managed Care – PPO | Admitting: Family Medicine

## 2013-12-18 ENCOUNTER — Telehealth: Payer: Self-pay

## 2013-12-18 VITALS — BP 130/80 | HR 50 | Temp 98.2°F | Resp 16 | Ht 64.0 in | Wt 165.0 lb

## 2013-12-18 DIAGNOSIS — R252 Cramp and spasm: Secondary | ICD-10-CM

## 2013-12-18 DIAGNOSIS — E785 Hyperlipidemia, unspecified: Secondary | ICD-10-CM

## 2013-12-18 DIAGNOSIS — F419 Anxiety disorder, unspecified: Secondary | ICD-10-CM

## 2013-12-18 DIAGNOSIS — I10 Essential (primary) hypertension: Secondary | ICD-10-CM

## 2013-12-18 DIAGNOSIS — M159 Polyosteoarthritis, unspecified: Secondary | ICD-10-CM

## 2013-12-18 DIAGNOSIS — M150 Primary generalized (osteo)arthritis: Secondary | ICD-10-CM

## 2013-12-18 DIAGNOSIS — M8949 Other hypertrophic osteoarthropathy, multiple sites: Secondary | ICD-10-CM

## 2013-12-18 DIAGNOSIS — Z23 Encounter for immunization: Secondary | ICD-10-CM

## 2013-12-18 DIAGNOSIS — Z0000 Encounter for general adult medical examination without abnormal findings: Secondary | ICD-10-CM

## 2013-12-18 DIAGNOSIS — Z1159 Encounter for screening for other viral diseases: Secondary | ICD-10-CM

## 2013-12-18 DIAGNOSIS — Z7689 Persons encountering health services in other specified circumstances: Secondary | ICD-10-CM

## 2013-12-18 MED ORDER — CYCLOBENZAPRINE HCL 10 MG PO TABS: 10.0000 mg | ORAL_TABLET | Freq: Two times a day (BID) | ORAL | Status: DC | PRN

## 2013-12-18 MED ORDER — LISINOPRIL 20 MG PO TABS: ORAL_TABLET | ORAL | Status: DC

## 2013-12-18 MED ORDER — DIAZEPAM 10 MG PO TABS: 10.0000 mg | ORAL_TABLET | Freq: Two times a day (BID) | ORAL | Status: DC | PRN

## 2013-12-18 MED ORDER — ATENOLOL 50 MG PO TABS: ORAL_TABLET | ORAL | Status: DC

## 2013-12-18 MED ORDER — TRAMADOL HCL 50 MG PO TABS
50.0000 mg | ORAL_TABLET | Freq: Two times a day (BID) | ORAL | Status: DC | PRN
Start: 2013-12-18 — End: 2014-03-01

## 2013-12-18 NOTE — Progress Notes (Signed)
[facility] [address] [place] [address] [phone]  Date:  12/18/2013   Name:  Julie Lee   DOB:  09/01/1955   MRN:  [id number]  PCP:  Lamar Blinks, MD    Chief Complaint: Annual Exam   History of Present Illness:  Julie Lee is a 58 y.o. very pleasant female patient who presents with the following:  She would like to do a CPE today.   She is doing well overall.  She may occasinally feel lightheaded if she stands up too fast and wonders if her BP medication is to blame We have tried taking her off atenolol the past, but her BP did go up.  She takes 50 mg once a day and also lisinopril 20 daily She had a protein shake this am.   She had a pap last year.   Her mammogram and colonoscopy and bone density scans were all done this year- looked good per her knowledge.    She is on prednisone at a low dose for esophagitis.  This does help with her esophagus but she is worried about long term use.  She was supposed to be on a steroid inhaler instead but it was too expensive.    She does not have any cardiac disease as far as she knows except for HTN.   She may walk 20-30 minutes with her dog for exercise; admits this is not very vigorous.   No chest pain.    She has arthritis and uses tramadol as needed- she takes 4 daily, 2 BID.  She also uses valium 47m "a couple of times a week."  This use depends on her current anxiety situation.  She also has some leg cramps and would like some flexeril to use as needed at night.    Pulse Readings from Last 3 Encounters:  12/18/13 50  08/09/13 67  06/26/13 56     Patient Active Problem List   Diagnosis Date Noted  . HTN (hypertension) 08/23/2012  . OA (osteoarthritis) 08/23/2012  . Ulcerative colitis, unspecified 08/23/2012    Past Medical History  Diagnosis Date  . Arthritis   . Hypertension   . Allergy   . Anxiety   . Colitis   . Kidney stones   . UTI (lower urinary tract infection)   .  Anemia     Past Surgical History  Procedure Laterality Date  . Cesarean section    . Tubal ligation    . Colonoscopy      History  Substance Use Topics  . Smoking status: Never Smoker   . Smokeless tobacco: Never Used  . Alcohol Use: Yes     Comment: rarely    Family History  Problem Relation Age of Onset  . Colon cancer Maternal Uncle 64 . Colitis Brother     had colectomy  . Diabetes Paternal Uncle   . Diabetes Paternal Grandfather   . Heart disease Paternal Grandfather   . Kidney disease Paternal Uncle   . Heart disease Father   . Heart disease Maternal Grandfather   . Hyperlipidemia Mother     No Known Allergies  Medication list has been reviewed and updated.  Current Outpatient Prescriptions on File Prior to Visit  Medication Sig Dispense Refill  . atenolol (TENORMIN) 50 MG tablet Take 1 tablet by mouth daily.  90 tablet  3  . diazepam (VALIUM) 10 MG tablet Take 1 tablet (10 mg total) by mouth every 12 (twelve)  hours as needed for anxiety.  30 tablet  1  . lisinopril (PRINIVIL,ZESTRIL) 20 MG tablet 1 po daily for high blood pressure  90 tablet  3  . mesalamine (LIALDA) 1.2 G EC tablet Take 2 tablets (2.4 g total) by mouth 2 (two) times daily.  120 tablet  11  . Pediatric Multiple Vitamins (CHEWABLE MULTIPLE VITAMINS PO) Take by mouth.      . predniSONE (DELTASONE) 5 MG tablet Take 1 tablet (5 mg total) by mouth daily with breakfast.  30 tablet  3  . traMADol (ULTRAM) 50 MG tablet Take 1-2 tablets (50-100 mg total) by mouth 2 (two) times daily as needed.  120 tablet  1   No current facility-administered medications on file prior to visit.    Review of Systems:  As per HPI- otherwise negative.   Physical Examination: Filed Vitals:   12/18/13 1250  BP: 130/80  Pulse: 50  Temp: 98.2 F (36.8 C)  Resp: 16   Filed Vitals:   12/18/13 1250  Height: 5' 4"  (1.626 m)  Weight: 165 lb (74.844 kg)   Body mass index is 28.31 kg/(m^2). Ideal Body Weight:  Weight in (lb) to have BMI = 25: 145.3  GEN: WDWN, NAD, Non-toxic, A & O x 3, looks well HEENT: Atraumatic, Normocephalic. Neck supple. No masses, No LAD.  Bilateral TM wnl, oropharynx normal.  PEERL,EOMI.   Ears and Nose: No external deformity. CV: RRR, No M/G/R. No JVD. No thrill. No extra heart sounds. PULM: CTA B, no wheezes, crackles, rhonchi. No retractions. No resp. distress. No accessory muscle use. ABD: S, NT. ND. No rebound. No HSM. EXTR: No c/c/e NEURO Normal gait.  PSYCH: Normally interactive. Conversant. Not depressed or anxious appearing.  Calm demeanor.    Assessment and Plan: Physical exam  Immunizations reviewed and up to date - Plan: Flu Vaccine QUAD 36+ mos IM  Hyperlipidemia - Plan: Lipid panel  Essential hypertension, benign - Plan: lisinopril (PRINIVIL,ZESTRIL) 20 MG tablet, atenolol (TENORMIN) 50 MG tablet  Need for hepatitis C screening test - Plan: Hepatitis C antibody  Primary osteoarthritis involving multiple joints - Plan: traMADol (ULTRAM) 50 MG tablet  Anxiety - Plan: diazepam (VALIUM) 10 MG tablet  Cramp of both lower extremities - Plan: cyclobenzaprine (FLEXERIL) 10 MG tablet  Flu shot today, recommended zostavax at her convenience.  She does have mild bradycardia- will have her increase her lisinopril to 40 and cut her atenolol in half.  She will keep an eye on her BP with her home cuff and let me know how she is doing Refilled her tramadol, valium and gave a supply of flexeril to use as needed- cautioned regarding using these medications in combination due to sedation risk.   See patient instructions for more details.      Signed Lamar Blinks, MD

## 2013-12-18 NOTE — Patient Instructions (Addendum)
I will be in touch with your labs asap.  Great to see you today Please cut your atenolol in half (25 mg), and increase your lisinopril to 19m a day.  Let's see if this controls your blood pressure and improves your energy.  Step up your exercise routine to help protect your bones and your heart.  Please send me a mychart message regarding your blood pressure in a couple of weeks Continue to use the tramadol as needed.  You can also use the diazepam as needed for anxiety but use it as sparingly as possible.  Remember that the combination of tramadol, diazepam and flexeril (muscle relaxer) can make you more sleepy!  Avoid using these medications in combination.     Influenza Vaccine (Flu Vaccine, Inactivated or Recombinant) 2014-2015: What You Need to Know 1. Why get vaccinated? Influenza ("flu") is a contagious disease that spreads around the [place], usually between October and May. Flu is caused by influenza viruses, and is spread mainly by coughing, sneezing, and close contact. Anyone can get flu, but the risk of getting flu is highest among children. Symptoms come on suddenly and may last several days. They can include:  fever/chills  sore throat  muscle aches  fatigue  cough  headache  runny or stuffy nose Flu can make some people much sicker than others. These people include young children, people 615and older, pregnant women, and people with certain health conditions-such as heart, lung or kidney disease, nervous system disorders, or a weakened immune system. Flu vaccination is especially important for these people, and anyone in close contact with them. Flu can also lead to pneumonia, and make existing medical conditions worse. It can cause diarrhea and seizures in children. Each year thousands of people in the [place] die from flu, and many more are hospitalized. Flu vaccine is the best protection against flu and its complications. Flu vaccine also helps  prevent spreading flu from person to person. 2. Inactivated and recombinant flu vaccines You are getting an injectable flu vaccine, which is either an "inactivated" or "recombinant" vaccine. These vaccines do not contain any live influenza virus. They are given by injection with a needle, and often called the "flu shot."  A different live, attenuated (weakened) influenza vaccine is sprayed into the nostrils. This vaccine is described in a separate Vaccine Information Statement. Flu vaccination is recommended every year. Some children 6 months through 877years of age might need two doses during one year. Flu viruses are always changing. Each year's flu vaccine is made to protect against 3 or 4 viruses that are likely to cause disease that year. Flu vaccine cannot prevent all cases of flu, but it is the best defense against the disease.  It takes about 2 weeks for protection to develop after the vaccination, and protection lasts several months to a year. Some illnesses that are not caused by influenza virus are often mistaken for flu. Flu vaccine will not prevent these illnesses. It can only prevent influenza. Some inactivated flu vaccine contains a very small amount of a mercury-based preservative called thimerosal. Studies have shown that thimerosal in vaccines is not harmful, but flu vaccines that do not contain a preservative are available. 3. Some people should not get this vaccine Tell the person who gives you the vaccine:  If you have any severe, life-threatening allergies. If you ever had a life-threatening allergic reaction after a dose of flu vaccine, or have a severe allergy to any part  of this vaccine, including (for example) an allergy to gelatin, antibiotics, or eggs, you may be advised not to get vaccinated. Most, but not all, types of flu vaccine contain a small amount of egg protein.  If you ever had Guillain-Barr Syndrome (a severe paralyzing illness, also called GBS). Some people with  a history of GBS should not get this vaccine. This should be discussed with your doctor.  If you are not feeling well. It is usually okay to get flu vaccine when you have a mild illness, but you might be advised to wait until you feel better. You should come back when you are better. 4. Risks of a vaccine reaction With a vaccine, like any medicine, there is a chance of side effects. These are usually mild and go away on their own. Problems that could happen after any vaccine:  Brief fainting spells can happen after any medical procedure, including vaccination. Sitting or lying down for about 15 minutes can help prevent fainting, and injuries caused by a fall. Tell your doctor if you feel dizzy, or have vision changes or ringing in the ears.  Severe shoulder pain and reduced range of motion in the arm where a shot was given can happen, very rarely, after a vaccination.  Severe allergic reactions from a vaccine are very rare, estimated at less than 1 in a million doses. If one were to occur, it would usually be within a few minutes to a few hours after the vaccination. Mild problems following inactivated flu vaccine:  soreness, redness, or swelling where the shot was given  hoarseness  sore, red or itchy eyes  cough  fever  aches  headache  itching  fatigue If these problems occur, they usually begin soon after the shot and last 1 or 2 days. Moderate problems following inactivated flu vaccine:  Young children who get inactivated flu vaccine and pneumococcal vaccine (PCV13) at the same time may be at increased risk for seizures caused by fever. Ask your doctor for more information. Tell your doctor if a child who is getting flu vaccine has ever had a seizure. Inactivated flu vaccine does not contain live flu virus, so you cannot get the flu from this vaccine. As with any medicine, there is a very remote chance of a vaccine causing a serious injury or death. The safety of vaccines is  always being monitored. For more information, visit: http://www.aguilar.org/ 5. What if there is a serious reaction? What should I look for?  Look for anything that concerns you, such as signs of a severe allergic reaction, very high fever, or behavior changes. Signs of a severe allergic reaction can include hives, swelling of the face and throat, difficulty breathing, a fast heartbeat, dizziness, and weakness. These would start a few minutes to a few hours after the vaccination. What should I do?  If you think it is a severe allergic reaction or other emergency that can't wait, call 9-1-1 and get the person to the nearest hospital. Otherwise, call your doctor.  Afterward, the reaction should be reported to the Vaccine Adverse Event Reporting System (VAERS). Your doctor should file this report, or you can do it yourself through the VAERS web site at www.vaers.SamedayNews.es, or by calling 401-667-9300. VAERS does not give medical advice. 6. The National Vaccine Injury Compensation Program The [employer] Vaccine Injury Compensation Program (VICP) is a federal program that was created to compensate people who may have been injured by certain vaccines. Persons who believe they may have been  injured by a vaccine can learn about the program and about filing a claim by calling 8595324396 or visiting the [place] website at GoldCloset.com.ee. There is a time limit to file a claim for compensation. 7. How can I learn more?  Ask your health care provider.  Call your local or state health department.  Contact the Centers for Disease Control and Prevention (CDC):  Call 531-613-6566 (1-800-CDC-INFO) or  Visit CDC's website at https://gibson.com/ CDC Vaccine Information Statement (Interim) Inactivated Influenza Vaccine (10/23/2012) Document Released: 12/16/2005 Document Revised: 07/08/2013 Document Reviewed: 02/08/2013 [facility] Patient Information 2015 [place]. This information is not  intended to replace advice given to you by your health care provider. Make sure you discuss any questions you have with your health care provider.

## 2013-12-18 NOTE — Telephone Encounter (Signed)
Pt of Dr. Lorelei Pont states that she normally is prescribed her traMADol (ULTRAM) 50 MG tablet with 4 refills, so that she does not have to come in every month, she notices that there was only one refill on her script: would like to know if it can be revised.

## 2013-12-19 LAB — LIPID PANEL
CHOL/HDL RATIO: 3.2 ratio
CHOLESTEROL: 237 mg/dL — AB (ref 0–200)
HDL: 73 mg/dL (ref >39)
LDL Cholesterol: 131 mg/dL — ABNORMAL HIGH (ref 0–99)
Triglycerides: 163 mg/dL — ABNORMAL HIGH (ref <150)
VLDL: 33 mg/dL (ref 0–40)

## 2013-12-19 LAB — HEPATITIS C ANTIBODY: HCV AB: NEGATIVE

## 2014-01-03 ENCOUNTER — Telehealth: Payer: Self-pay | Admitting: Radiology

## 2014-01-03 NOTE — Telephone Encounter (Signed)
Pt called about lab results. Gave her those results.

## 2014-03-01 ENCOUNTER — Other Ambulatory Visit: Payer: Self-pay | Admitting: Family Medicine

## 2014-03-01 DIAGNOSIS — M159 Polyosteoarthritis, unspecified: Secondary | ICD-10-CM

## 2014-03-01 DIAGNOSIS — M150 Primary generalized (osteo)arthritis: Principal | ICD-10-CM

## 2014-03-01 DIAGNOSIS — M8949 Other hypertrophic osteoarthropathy, multiple sites: Secondary | ICD-10-CM

## 2014-04-25 ENCOUNTER — Other Ambulatory Visit: Payer: Self-pay | Admitting: Family Medicine

## 2014-04-25 NOTE — Telephone Encounter (Signed)
Faxed. Notified pt

## 2014-04-25 NOTE — Telephone Encounter (Signed)
Pt would like to know if she can have a months worth of refills until she is able to come in and see Dr. Lorelei Pont

## 2014-04-25 NOTE — Addendum Note (Signed)
Addended by: Elwyn Reach A on: 04/25/2014 05:09 PM   Modules accepted: Orders

## 2014-04-25 NOTE — Telephone Encounter (Signed)
Spoke with pt she needs a refill on Tramadol. She is trying to see Dr Lorelei Pont this week but she is not here this weekend. She is planning to come in on a weekend Dr. Lorelei Pont works. Please advise.

## 2014-05-23 ENCOUNTER — Ambulatory Visit (INDEPENDENT_AMBULATORY_CARE_PROVIDER_SITE_OTHER): Payer: No Typology Code available for payment source | Admitting: Family Medicine

## 2014-05-23 VITALS — BP 124/90 | HR 57 | Temp 98.5°F | Resp 16 | Ht 64.5 in | Wt 168.8 lb

## 2014-05-23 DIAGNOSIS — M159 Polyosteoarthritis, unspecified: Secondary | ICD-10-CM

## 2014-05-23 DIAGNOSIS — M150 Primary generalized (osteo)arthritis: Secondary | ICD-10-CM

## 2014-05-23 DIAGNOSIS — I10 Essential (primary) hypertension: Secondary | ICD-10-CM | POA: Diagnosis not present

## 2014-05-23 DIAGNOSIS — F419 Anxiety disorder, unspecified: Secondary | ICD-10-CM

## 2014-05-23 DIAGNOSIS — R252 Cramp and spasm: Secondary | ICD-10-CM

## 2014-05-23 DIAGNOSIS — M8949 Other hypertrophic osteoarthropathy, multiple sites: Secondary | ICD-10-CM

## 2014-05-23 LAB — COMPREHENSIVE METABOLIC PANEL
ALBUMIN: 3.9 g/dL (ref 3.5–5.2)
ALT: 17 U/L (ref 0–35)
AST: 16 U/L (ref 0–37)
Alkaline Phosphatase: 67 U/L (ref 39–117)
BUN: 15 mg/dL (ref 6–23)
CALCIUM: 9.7 mg/dL (ref 8.4–10.5)
CHLORIDE: 103 meq/L (ref 96–112)
CO2: 25 meq/L (ref 19–32)
Creat: 0.7 mg/dL (ref 0.50–1.10)
Glucose, Bld: 74 mg/dL (ref 70–99)
POTASSIUM: 4.2 meq/L (ref 3.5–5.3)
Sodium: 141 mEq/L (ref 135–145)
Total Bilirubin: 0.4 mg/dL (ref 0.2–1.2)
Total Protein: 6.9 g/dL (ref 6.0–8.3)

## 2014-05-23 MED ORDER — DIAZEPAM 10 MG PO TABS: 10.0000 mg | ORAL_TABLET | Freq: Two times a day (BID) | ORAL | Status: DC | PRN

## 2014-05-23 MED ORDER — TRAMADOL HCL 50 MG PO TABS: ORAL_TABLET | ORAL | Status: DC

## 2014-05-23 MED ORDER — CYCLOBENZAPRINE HCL 10 MG PO TABS: 10.0000 mg | ORAL_TABLET | Freq: Two times a day (BID) | ORAL | Status: DC | PRN

## 2014-05-23 MED ORDER — ATENOLOL 50 MG PO TABS: ORAL_TABLET | ORAL | Status: DC

## 2014-05-23 MED ORDER — LISINOPRIL 20 MG PO TABS: ORAL_TABLET | ORAL | Status: DC

## 2014-05-23 NOTE — Patient Instructions (Addendum)
I am sorry that your arthritis is bothering you so much  We will refer you to hand therapy to see if they have any ideas I will be in touch with your labs asap

## 2014-05-23 NOTE — Progress Notes (Addendum)
[facility] [address], [place] [address] [phone]  Date:  05/23/2014   Name:  Julie Lee   DOB:  12/10/1955   MRN:  [id number]  PCP:  Lamar Blinks, MD    Chief Complaint: Medication Refill   History of Present Illness:  Julie Lee is a 59 y.o. very pleasant female patient who presents with the following:  She is here today for medication refills.   She is taking 2 tramadol BID for her OA.  She also uses flexeril for this at times She is no longer taking prednisone- this was rx per her GI for her UC.  However she has found that by modifying her eating schedule she is able to get away with not using this.  She uses valium as needed- she could use a RF on this to hold She has a lot of OA pain in her hands, esp the left pinky finger.  Her knees and feet also hurt . She would like to try hand therapy to see if this might help her We tried decreasing her metoprolol last visit in hopes of bringing her HR up, but this just raised her BP so she is back on a whole metoprolol   Pulse Readings from Last 3 Encounters:  05/23/14 57  12/18/13 50  08/09/13 67     Patient Active Problem List   Diagnosis Date Noted  . HTN (hypertension) 08/23/2012  . OA (osteoarthritis) 08/23/2012  . Ulcerative colitis, unspecified 08/23/2012    Past Medical History  Diagnosis Date  . Arthritis   . Hypertension   . Allergy   . Anxiety   . Colitis   . Kidney stones   . UTI (lower urinary tract infection)   . Anemia     Past Surgical History  Procedure Laterality Date  . Cesarean section    . Tubal ligation    . Colonoscopy      History  Substance Use Topics  . Smoking status: Never Smoker   . Smokeless tobacco: Never Used  . Alcohol Use: Yes     Comment: rarely    Family History  Problem Relation Age of Onset  . Colon cancer Maternal Uncle 13  . Colitis Brother     had colectomy  . Diabetes Paternal Uncle   . Diabetes Paternal Grandfather    . Heart disease Paternal Grandfather   . Kidney disease Paternal Uncle   . Heart disease Father   . Heart disease Maternal Grandfather   . Hyperlipidemia Mother     No Known Allergies  Medication list has been reviewed and updated.  Current Outpatient Prescriptions on File Prior to Visit  Medication Sig Dispense Refill  . atenolol (TENORMIN) 50 MG tablet Take 1 tablet by mouth daily.  May decrease to 1/2 tab as tolerated 90 tablet 3  . cyclobenzaprine (FLEXERIL) 10 MG tablet Take 1 tablet (10 mg total) by mouth 2 (two) times daily as needed for muscle spasms. 20 tablet 0  . diazepam (VALIUM) 10 MG tablet Take 1 tablet (10 mg total) by mouth every 12 (twelve) hours as needed for anxiety. 30 tablet 1  . lisinopril (PRINIVIL,ZESTRIL) 20 MG tablet 2 (21m) po daily for high blood pressure 180 tablet 3  . mesalamine (LIALDA) 1.2 G EC tablet Take 2 tablets (2.4 g total) by mouth 2 (two) times daily. 120 tablet 11  . Pediatric Multiple Vitamins (CHEWABLE MULTIPLE VITAMINS PO) Take by mouth.    .Marland Kitchen  traMADol (ULTRAM) 50 MG tablet TAKE ONE TO TWO TABLETS BY MOUTH TWICE DAILY AS NEEDED FOR PAIN 120 tablet 0  . predniSONE (DELTASONE) 5 MG tablet Take 1 tablet (5 mg total) by mouth daily with breakfast. (Patient not taking: Reported on 05/23/2014) 30 tablet 3   No current facility-administered medications on file prior to visit.    Review of Systems:  As per HPI- otherwise negative.   Physical Examination: Filed Vitals:   05/23/14 1457  BP: 124/90  Pulse: 57  Temp: 98.5 F (36.9 C)  Resp: 16   Filed Vitals:   05/23/14 1457  Height: 5' 4.5" (1.638 m)  Weight: 168 lb 12.8 oz (76.567 kg)   Body mass index is 28.54 kg/(m^2). Ideal Body Weight: Weight in (lb) to have BMI = 25: 147.6  GEN: WDWN, NAD, Non-toxic, A & O x 3, overweight, looks well HEENT: Atraumatic, Normocephalic. Neck supple. No masses, No LAD. Ears and Nose: No external deformity. CV: RRR, No M/G/R. No JVD. No thrill. No  extra heart sounds. PULM: CTA B, no wheezes, crackles, rhonchi. No retractions. No resp. distress. No accessory muscle use.Marland Kitchen EXTR: No c/c/e NEURO Normal gait.  PSYCH: Normally interactive. Conversant. Not depressed or anxious appearing.  Calm demeanor.  Her hand joints show enlargement, nodules and tenderenss- more on the left hand, most on the pinky finger  Assessment and Plan: Essential hypertension, benign - Plan: atenolol (TENORMIN) 50 MG tablet, lisinopril (PRINIVIL,ZESTRIL) 20 MG tablet, Comprehensive metabolic panel  Anxiety - Plan: diazepam (VALIUM) 10 MG tablet  Cramp of both lower extremities - Plan: cyclobenzaprine (FLEXERIL) 10 MG tablet  Primary osteoarthritis involving multiple joints - Plan: traMADol (ULTRAM) 50 MG tablet, Ambulatory referral to Physical Therapy  BP is well controlled- refilled her medications She has OA- refilled her pain medication and flexeril Referral to hand and rehab to see if they can offer her anything for her hands   Signed Lamar Blinks, MD

## 2014-06-20 NOTE — Addendum Note (Signed)
Addended by: Lamar Blinks C on: 06/20/2014 05:05 AM   Modules accepted: Level of Service

## 2014-07-02 ENCOUNTER — Telehealth: Payer: Self-pay | Admitting: Gastroenterology

## 2014-07-03 NOTE — Telephone Encounter (Signed)
Call placed to the pt to see what her insurance will cover.  Insurance will cover colazal can we send?

## 2014-07-04 MED ORDER — BALSALAZIDE DISODIUM 750 MG PO CAPS: 2250.0000 mg | ORAL_CAPSULE | Freq: Three times a day (TID) | ORAL | Status: DC

## 2014-07-04 NOTE — Telephone Encounter (Signed)
Ok please call her in colazal 780m pill, take 3 pills 3 times daily; disp one month with 2 refills.  I have not seen her in about a year, aske her to schedule follow up, next available.

## 2014-07-04 NOTE — Telephone Encounter (Signed)
Pt has been notified and colazal was sent to pharmacy of choice, pt will call back when she is at work with her schedule to set up appt

## 2014-07-09 ENCOUNTER — Other Ambulatory Visit: Payer: Self-pay

## 2014-07-09 MED ORDER — MESALAMINE 1.2 G PO TBEC: 2.4000 g | DELAYED_RELEASE_TABLET | Freq: Two times a day (BID) | ORAL | Status: DC

## 2014-12-21 ENCOUNTER — Other Ambulatory Visit: Payer: Self-pay | Admitting: Family Medicine

## 2015-01-30 ENCOUNTER — Ambulatory Visit (INDEPENDENT_AMBULATORY_CARE_PROVIDER_SITE_OTHER): Payer: Self-pay | Admitting: Family Medicine

## 2015-01-30 VITALS — BP 112/70 | HR 74 | Temp 98.2°F | Resp 18 | Ht 65.0 in | Wt 169.6 lb

## 2015-01-30 DIAGNOSIS — F419 Anxiety disorder, unspecified: Secondary | ICD-10-CM

## 2015-01-30 DIAGNOSIS — G894 Chronic pain syndrome: Secondary | ICD-10-CM

## 2015-01-30 DIAGNOSIS — R252 Cramp and spasm: Secondary | ICD-10-CM

## 2015-01-30 MED ORDER — DIAZEPAM 10 MG PO TABS: 10.0000 mg | ORAL_TABLET | Freq: Two times a day (BID) | ORAL | Status: DC | PRN

## 2015-01-30 MED ORDER — TRAMADOL HCL 50 MG PO TABS: ORAL_TABLET | ORAL | Status: DC

## 2015-01-30 MED ORDER — GABAPENTIN 100 MG PO CAPS: 100.0000 mg | ORAL_CAPSULE | Freq: Three times a day (TID) | ORAL | Status: DC

## 2015-01-30 MED ORDER — CYCLOBENZAPRINE HCL 10 MG PO TABS: 10.0000 mg | ORAL_TABLET | Freq: Two times a day (BID) | ORAL | Status: DC | PRN

## 2015-01-30 NOTE — Patient Instructions (Signed)
I hope that the gabapentin will help with your pain Try increasing to 200 and then 300 mg a day Remember that several of your meds (tramadol, valium, flexeril, and gabapentin) can all make you sleepy.  I would advise that you not take these in combination

## 2015-01-30 NOTE — Progress Notes (Signed)
[facility] [address], [place] [address] [phone]  Date:  01/30/2015   Name:  Julie Lee   DOB:  1955/04/20   MRN:  [id number]  PCP:  Lamar Blinks, MD    Chief Complaint: OTHER   History of Present Illness:  Julie Lee is a 59 y.o. very pleasant female patient who presents with the following:  Here today to discuss her medications-  She is taking 4 tramadol a day due to her arthritis and chronic pain    She notes pain in her lower back, legs, shoulders, neck She wonders if she might have FBM- she sometimes notes that her muscles are very tender to the touch Her pains are worse when she is working- on the weekend she does ok when she gets more rest.  A friend gave her a few gabapentin 100 mg to try which she has been taking- one a day.  She feels like this helps her a lot, and this even enabled her to cut down to 1 tramadol in the afternoon  She is also treated for HTN with lisinopril and atenolol  She occasionally uses valium or flexeril for anxiety attack or muscle spasm- requests more of these also  Patient Active Problem List   Diagnosis Date Noted  . HTN (hypertension) 08/23/2012  . OA (osteoarthritis) 08/23/2012  . Ulcerative colitis, unspecified 08/23/2012    Past Medical History  Diagnosis Date  . Arthritis   . Hypertension   . Allergy   . Anxiety   . Colitis   . Kidney stones   . UTI (lower urinary tract infection)   . Anemia     Past Surgical History  Procedure Laterality Date  . Cesarean section    . Tubal ligation    . Colonoscopy      Social History  Substance Use Topics  . Smoking status: Never Smoker   . Smokeless tobacco: Never Used  . Alcohol Use: Yes     Comment: rarely    Family History  Problem Relation Age of Onset  . Colon cancer Maternal Uncle 46  . Colitis Brother     had colectomy  . Diabetes Paternal Uncle   . Diabetes Paternal Grandfather   . Heart disease Paternal Grandfather    . Kidney disease Paternal Uncle   . Heart disease Father   . Heart disease Maternal Grandfather   . Hyperlipidemia Mother     No Known Allergies  Medication list has been reviewed and updated.  Current Outpatient Prescriptions on File Prior to Visit  Medication Sig Dispense Refill  . atenolol (TENORMIN) 50 MG tablet Take 1 tablet by mouth daily. 90 tablet 3  . balsalazide (COLAZAL) 750 MG capsule Take 3 capsules (2,250 mg total) by mouth 3 (three) times daily. 270 capsule 3  . cyclobenzaprine (FLEXERIL) 10 MG tablet Take 1 tablet (10 mg total) by mouth 2 (two) times daily as needed for muscle spasms. 20 tablet 0  . diazepam (VALIUM) 10 MG tablet Take 1 tablet (10 mg total) by mouth every 12 (twelve) hours as needed for anxiety. 30 tablet 1  . lisinopril (PRINIVIL,ZESTRIL) 20 MG tablet 1 tablet po daily for high blood pressure 90 tablet 3  . Pediatric Multiple Vitamins (CHEWABLE MULTIPLE VITAMINS PO) Take by mouth.    . traMADol (ULTRAM) 50 MG tablet TAKE ONE TO TWO TABLETS BY MOUTH TWICE DAILY AS NEEDED FOR PAIN 120 tablet 0  . mesalamine (LIALDA) 1.2 G EC  tablet Take 2 tablets (2.4 g total) by mouth 2 (two) times daily. (Patient not taking: Reported on 01/30/2015) 120 tablet 6   No current facility-administered medications on file prior to visit.    Review of Systems:  As per HPI- otherwise negative.   Physical Examination: Filed Vitals:   01/30/15 1559  BP: 112/70  Pulse: 74  Temp: 98.2 F (36.8 C)  Resp: 18   Filed Vitals:   01/30/15 1559  Height: 5' 5"  (1.651 m)  Weight: 169 lb 9.6 oz (76.93 kg)   Body mass index is 28.22 kg/(m^2). Ideal Body Weight: Weight in (lb) to have BMI = 25: 149.9  GEN: WDWN, NAD, Non-toxic, A & O x 3, mild overweight, looks well HEENT: Atraumatic, Normocephalic. Neck supple. No masses, No LAD. Ears and Nose: No external deformity. CV: RRR, No M/G/R. No JVD. No thrill. No extra heart sounds. PULM: CTA B, no wheezes, crackles, rhonchi.  No retractions. No resp. distress. No accessory muscle use. EXTR: No c/c/e NEURO Normal gait.  PSYCH: Normally interactive. Conversant. Not depressed or anxious appearing.  Calm demeanor.   Assessment and Plan: Chronic pain syndrome - Plan: gabapentin (NEURONTIN) 100 MG capsule, traMADol (ULTRAM) 50 MG tablet, DISCONTINUED: traMADol (ULTRAM) 50 MG tablet  Cramp of both lower extremities - Plan: cyclobenzaprine (FLEXERIL) 10 MG tablet  Anxiety - Plan: diazepam (VALIUM) 10 MG tablet 'HTN is controlled on current regimen Refilled her tramadol, prn flexeril and valium She would like to continue to use gabapentin- will taper up to 300 mg a day She will keep me posted as to her progress We hope that she will be able to decrease her tramadol use   Signed Lamar Blinks, MD

## 2015-03-27 ENCOUNTER — Encounter: Payer: Self-pay | Admitting: Family Medicine

## 2015-04-01 ENCOUNTER — Encounter: Payer: Self-pay | Admitting: Family Medicine

## 2015-05-24 ENCOUNTER — Other Ambulatory Visit: Payer: Self-pay | Admitting: Family Medicine

## 2015-05-25 ENCOUNTER — Other Ambulatory Visit: Payer: Self-pay | Admitting: Family Medicine

## 2015-08-04 ENCOUNTER — Ambulatory Visit (INDEPENDENT_AMBULATORY_CARE_PROVIDER_SITE_OTHER): Payer: Self-pay | Admitting: Physician Assistant

## 2015-08-04 ENCOUNTER — Ambulatory Visit (INDEPENDENT_AMBULATORY_CARE_PROVIDER_SITE_OTHER): Payer: Self-pay

## 2015-08-04 VITALS — BP 120/70 | HR 69 | Temp 98.7°F | Resp 17 | Ht 65.0 in | Wt 173.2 lb

## 2015-08-04 DIAGNOSIS — M545 Low back pain: Secondary | ICD-10-CM

## 2015-08-04 DIAGNOSIS — M8580 Other specified disorders of bone density and structure, unspecified site: Secondary | ICD-10-CM

## 2015-08-04 DIAGNOSIS — R252 Cramp and spasm: Secondary | ICD-10-CM

## 2015-08-04 DIAGNOSIS — G894 Chronic pain syndrome: Secondary | ICD-10-CM

## 2015-08-04 DIAGNOSIS — F419 Anxiety disorder, unspecified: Secondary | ICD-10-CM

## 2015-08-04 MED ORDER — CYCLOBENZAPRINE HCL 10 MG PO TABS: 10.0000 mg | ORAL_TABLET | Freq: Two times a day (BID) | ORAL | Status: DC | PRN

## 2015-08-04 MED ORDER — TRAMADOL HCL 50 MG PO TABS: 50.0000 mg | ORAL_TABLET | Freq: Two times a day (BID) | ORAL | Status: DC

## 2015-08-04 MED ORDER — CHOLECALCIFEROL 50 MCG (2000 UT) PO TABS
1.0000 | ORAL_TABLET | Freq: Every day | ORAL | Status: DC
Start: 2015-08-04 — End: 2015-12-29

## 2015-08-04 MED ORDER — DIAZEPAM 10 MG PO TABS: 10.0000 mg | ORAL_TABLET | Freq: Two times a day (BID) | ORAL | Status: DC | PRN

## 2015-08-04 MED ORDER — CALCIUM CARBONATE 1250 (500 CA) MG PO TABS: 1.0000 | ORAL_TABLET | Freq: Every day | ORAL | Status: DC

## 2015-08-04 MED ORDER — HYDROCODONE-ACETAMINOPHEN 5-325 MG PO TABS: ORAL_TABLET | ORAL | Status: AC

## 2015-08-04 MED ORDER — HYDROCODONE-ACETAMINOPHEN 5-325 MG PO TABS: ORAL_TABLET | ORAL | Status: DC

## 2015-08-04 MED ORDER — GABAPENTIN 100 MG PO CAPS: 100.0000 mg | ORAL_CAPSULE | Freq: Three times a day (TID) | ORAL | Status: DC

## 2015-08-04 NOTE — Patient Instructions (Addendum)
     IF you received an x-ray today, you will receive an invoice from [facility]. Please contact [facility] at [phone] with questions or concerns regarding your invoice.   IF you received labwork today, you will receive an invoice from [employer]. Please contact [facility] at [phone] with questions or concerns regarding your invoice.   Our billing staff will not be able to assist you with questions regarding bills from these companies.  You will be contacted with the lab results as soon as they are available. The fastest way to get your results is to activate your My Chart account. Instructions are located on the last page of this paperwork. If you have not heard from [place] regarding the results in 2 weeks, please contact this office.     We can meet in 1-3 months for follow up.   I would like you to try to take one tablet of the norco once per week, however I will have 2 tablets per week. Take medication with food. This can make you constipated, so you can use an over the counter stool softener if needed.

## 2015-08-04 NOTE — Progress Notes (Signed)
[facility] [address], [place] [address] [phone]  Date:  08/04/2015   Name:  Julie Lee   DOB:  11/11/1955   MRN:  [id number]  PCP:  Lamar Blinks, MD    History of Present Illness:  Julie Lee is a 60 y.o. female patient who presents to [facility] for cc of low back pain.   Patient is here today for medication refill and to discuss her low back pain. She was once followed by Dr. Lorelei Pont [facility]. Her back pain is not well controlled on the tramadol that she has been taking consistently for months. She generally will take 1-2 tablets twice per day. She notes that she has low back pain that is severe by the end of the week. Pain can at times radiate down the left leg and towards the foot. He has also been using the Neurontin to help control the pain which she gets notices that it helps very little. She takes 1 tablet to 2 tablets daily (100-200).  There is no instability. She has no weakness. There is no incontinence. She has attempted lidocaine patches which has not helped. She is also used the Flexeril which helps somewhat. She would like to see about starting Norco. 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 She is currently taking a combo over-the-counter calcium/vitamin D medication. She has attempted to see pain clinic years ago, but she states that it did not help much. She was put into physical therapy and other modalities which she is also followed up with an orthopedist but stopped doing this due to lapse in insurance.     Patient Active Problem List   Diagnosis Date Noted  . HTN (hypertension) 08/23/2012  . OA (osteoarthritis) 08/23/2012  . Ulcerative colitis, unspecified 08/23/2012    Past Medical History  Diagnosis Date  . Arthritis   . Hypertension   . Allergy   . Anxiety   . Colitis   . Kidney stones   . UTI (lower urinary tract infection)    . Anemia     Past Surgical History  Procedure Laterality Date  . Cesarean section    . Tubal ligation    . Colonoscopy      Social History  Substance Use Topics  . Smoking status: Never Smoker   . Smokeless tobacco: Never Used  . Alcohol Use: Yes     Comment: rarely    Family History  Problem Relation Age of Onset  . Colon cancer Maternal Uncle 56  . Colitis Brother     had colectomy  . Diabetes Paternal Uncle   . Diabetes Paternal Grandfather   . Heart disease Paternal Grandfather   . Kidney disease Paternal Uncle   . Heart disease Father   . Heart disease Maternal Grandfather   . Hyperlipidemia Mother     No Known Allergies  Medication list has been reviewed and updated.  Current Outpatient Prescriptions on File Prior to Visit  Medication Sig Dispense Refill  . atenolol (TENORMIN) 50 MG tablet TAKE ONE TABLET BY MOUTH ONCE DAILY 90 tablet 0  . balsalazide (COLAZAL) 750 MG capsule Take 3 capsules (2,250 mg total) by mouth 3 (three) times daily. 270 capsule 3  . lisinopril (PRINIVIL,ZESTRIL) 20 MG tablet TAKE ONE TABLET BY MOUTH ONCE DAILY FOR BLOOD PRESSURE 90 tablet 0  . Pediatric Multiple Vitamins (CHEWABLE MULTIPLE  VITAMINS PO) Take by mouth.     No current facility-administered medications on file prior to visit.    ROS ROS otherwise unremarkable unless listed above.   Physical Examination: BP 120/70 mmHg  Pulse 69  Temp(Src) 98.7 F (37.1 C) (Oral)  Resp 17  Ht 5' 5"  (1.651 m)  Wt 173 lb 3.2 oz (78.563 kg)  BMI 28.82 kg/m2  SpO2 96% Ideal Body Weight: Weight in (lb) to have BMI = 25: 149.9  Physical Exam  Constitutional: She is oriented to person, place, and time. She appears well-developed and well-nourished. No distress.  HENT:  Head: Normocephalic and atraumatic.  Right Ear: External ear normal.  Left Ear: External ear normal.  Eyes: Conjunctivae and EOM are normal. Pupils are equal, round, and reactive to light.  Cardiovascular: Normal  rate.   Pulmonary/Chest: Effort normal. No respiratory distress.  Musculoskeletal:       Lumbar back: She exhibits tenderness (low lumbar, proximal sacrum spinal tenderness.  no swelling appreciated.  ) and bony tenderness. She exhibits normal range of motion, no swelling, no deformity and no spasm.  Neurological: She is alert and oriented to person, place, and time.  Skin: She is not diaphoretic.  Psychiatric: She has a normal mood and affect. Her behavior is normal.   Dg Lumbar Spine Complete  08/04/2015  CLINICAL DATA:  Back pain. EXAM: LUMBAR SPINE - COMPLETE 4+ VIEW COMPARISON:  No prior. FINDINGS: Mild scoliosis concave left. Diffuse multilevel degenerative change noted. 9 mm anterolisthesis L4 on L5. Mild compression of T11 and T12 cannot be excluded. These findings may just be related to scoliosis present. No other focal abnormality identified. Pelvic calcifications consistent phleboliths. Rounded calcification in the right upper quadrant, this could represent a gallstone. Questionable calcification of the left flank. Renal stone cannot be excluded. IMPRESSION: 1. Mild scoliosis concave left. Mild compression T11 and T12 cannot be excluded on lateral view. These findings may just be related to scoliosis. Clinical correlation suggested. 2. Diffuse multilevel degenerative change with 9 mm anterolisthesis L4 on L5. 3. Small calcification right upper quadrant, gallstone cannot be excluded. Questionable faint calcification noted over the left mid flank. Left ureteral stone cannot be excluded. Multiple pelvic calcifications noted most likely phleboliths. Electronically Signed   By: [place]   On: 08/04/2015 12:19   Dg Sacrum/coccyx  08/04/2015  CLINICAL DATA:  Back pain.  Initial evaluation. EXAM: SACRUM AND COCCYX - 2+ VIEW COMPARISON:  None. FINDINGS: No acute bony abnormality identified. No evidence of fracture. Degenerative changes both SI joints. Diffuse osteopenia. IMPRESSION: Diffuse  osteopenia.  No evidence of fracture. Electronically Signed   By: Marcello Moores  Register   On: 08/04/2015 12:20   Assessment and Plan: Julie Lee is a 60 y.o. female who is here today for cc of low back pain and medication refill Anxiety is well controlled. She rarely takes the Valium. We will go ahead and refill this at this time. I have placed her on a calcium and vitamin D supplement at this time for what appears to be osteopenia. She is concerned with pricing and lack of insurance so we will hold off on a DEXA scan at this time. I am refilling her Neurontin today. She will also have a refill of Flexeril. We will start Norco at this time. I've advised her to do it once a week as needed. She continues to do as many times as twice a week as needed. We will follow-up in 1-3 months. I've advised her  to come to early me at this time for refills of his controlled substance, or she can follow up with someone within her urgent care. Given strict advice not to go outside of urgent medical and family care for refills. She can contact for 2 additional refills of the tramadol, and the norco.  Bilateral low back pain, with sciatica presence unspecified - Plan: DG Lumbar Spine Complete, DG Sacrum/Coccyx, traMADol (ULTRAM) 50 MG tablet, diazepam (VALIUM) 10 MG tablet, cyclobenzaprine (FLEXERIL) 10 MG tablet, gabapentin (NEURONTIN) 100 MG capsule, HYDROcodone-acetaminophen (NORCO) 5-325 MG tablet, DISCONTINUED: HYDROcodone-acetaminophen (NORCO) 5-325 MG tablet  Osteopenia - Plan: Cholecalciferol 2000 units TABS, calcium carbonate (OS-CAL - DOSED IN MG OF ELEMENTAL CALCIUM) 1250 (500 Ca) MG tablet, gabapentin (NEURONTIN) 100 MG capsule  Anxiety - Plan: diazepam (VALIUM) 10 MG tablet  Cramp of both lower extremities - Plan: cyclobenzaprine (FLEXERIL) 10 MG tablet  Chronic pain syndrome - Plan: gabapentin (NEURONTIN) 100 MG capsule    Ivar Drape, PA-C [facility] 08/04/2015 3:59 PM

## 2015-08-05 ENCOUNTER — Telehealth: Payer: Self-pay

## 2015-08-05 NOTE — Telephone Encounter (Signed)
Pt was seen here on 5/30 by [place]. She says she came in for a medication refill. She thought she was getting a refill on her lisinopril (PRINIVIL,ZESTRIL) 20 MG tablet [[id number]] and her atenolol (TENORMIN) 50 MG tablet [[id number]]. . She would like [place] to use Pharmacy:  [facility] [place], [place]. CB # [phone]

## 2015-08-07 ENCOUNTER — Telehealth: Payer: Self-pay | Admitting: Emergency Medicine

## 2015-08-07 NOTE — Telephone Encounter (Signed)
Pt made aware medication Lisinopril and Atenolol refilled and e-scribed to pharmacy

## 2015-08-11 ENCOUNTER — Telehealth: Payer: Self-pay

## 2015-08-11 NOTE — Telephone Encounter (Signed)
Attempted to call pt, left VM for pt to call back asap

## 2015-08-11 NOTE — Telephone Encounter (Signed)
Pt is calling to get lab results she got a email stating that the notes were on mychart but she does not know how to access this and would like someone to call her back to discuss them   Best number [phone]

## 2015-08-12 NOTE — Telephone Encounter (Signed)
Called and spoke with pt and she is aware of update in [place]

## 2015-09-01 ENCOUNTER — Other Ambulatory Visit: Payer: Self-pay | Admitting: Family Medicine

## 2015-09-01 ENCOUNTER — Other Ambulatory Visit: Payer: Self-pay | Admitting: Physician Assistant

## 2015-09-01 DIAGNOSIS — I10 Essential (primary) hypertension: Secondary | ICD-10-CM

## 2015-09-01 MED ORDER — LISINOPRIL 20 MG PO TABS: 20.0000 mg | ORAL_TABLET | Freq: Every day | ORAL | Status: DC

## 2015-09-01 MED ORDER — ATENOLOL 50 MG PO TABS: 50.0000 mg | ORAL_TABLET | Freq: Every day | ORAL | Status: DC

## 2015-09-01 NOTE — Progress Notes (Signed)
Per Barrie Folk, patient contacted [place] asking for refill of medication, as she is going out of town.  Medication refill sent to her pharmacy.

## 2015-09-01 NOTE — Telephone Encounter (Signed)
PATIENT STATES SHE WAS TOLD HER ATENOLOL 50 MG AND LISNOPRIL 20 MG HAD BEEN SENT ELECTRONICALLY TO THE [facility] A FEW WEEKS AGO. WHEN SHE WENT TO PICK IT UP TODAY SHE WAS TOLD THAT WE HAD NOT SENT ANYTHING. SHE IS COMPLETELY OUT OF HER BLOOD PRESSURE MEDICATION. STEPHANIE SAID THAT I COULD SEND THIS MESSAGE DIRECTLY TO HER. BEST PHONE [phone] (CELL)  PHARMACY CHOICE IS [facility]   MBC

## 2015-09-02 ENCOUNTER — Telehealth: Payer: Self-pay

## 2015-09-02 DIAGNOSIS — I10 Essential (primary) hypertension: Secondary | ICD-10-CM

## 2015-09-02 NOTE — Telephone Encounter (Signed)
Called Pharmacy they state they do have refills for patient. States they just received request this am. Request was confirmed on yesterday per note in System. Patient notified.

## 2015-09-02 NOTE — Telephone Encounter (Signed)
Pt is needing to talk with someone about her blood pressure meds and why she only got a 30 day supply with no refills  Just in to be seen the first of June [phone]

## 2015-09-03 NOTE — Telephone Encounter (Signed)
We took care of anxiety and the back pain.  I did no labs or followed up with htn, as that was not mentioned at our visit.  We have no labs from her since last march. Let's follow up on blood pressure.  I will refill for 2 additional months if she would like.

## 2015-09-03 NOTE — Telephone Encounter (Signed)
Julie Lee did you mean to give one refill or can I add refills?

## 2015-09-07 MED ORDER — LISINOPRIL 20 MG PO TABS: 20.0000 mg | ORAL_TABLET | Freq: Every day | ORAL | Status: DC

## 2015-09-07 MED ORDER — ATENOLOL 50 MG PO TABS: 50.0000 mg | ORAL_TABLET | Freq: Every day | ORAL | Status: DC

## 2015-09-07 NOTE — Telephone Encounter (Signed)
Sent in add'l RFs. Will need to call pt to advise.

## 2015-09-10 NOTE — Telephone Encounter (Signed)
LMOM for pt advising of add'l RFs and explanation as to why HTN f/up w/ labs are needed before these 3 RFs run out.

## 2015-09-23 ENCOUNTER — Telehealth: Payer: Self-pay

## 2015-09-23 NOTE — Telephone Encounter (Signed)
Pt needs a refill on the hydrocodone. She would like for stephanie to call her as well.  Please advise  [phone]

## 2015-09-30 ENCOUNTER — Ambulatory Visit (INDEPENDENT_AMBULATORY_CARE_PROVIDER_SITE_OTHER): Payer: Self-pay | Admitting: Physician Assistant

## 2015-09-30 VITALS — BP 116/72 | HR 65 | Temp 98.2°F | Resp 16 | Ht 65.0 in | Wt 179.2 lb

## 2015-09-30 DIAGNOSIS — R252 Cramp and spasm: Secondary | ICD-10-CM

## 2015-09-30 DIAGNOSIS — M545 Low back pain: Secondary | ICD-10-CM

## 2015-09-30 DIAGNOSIS — I10 Essential (primary) hypertension: Secondary | ICD-10-CM

## 2015-09-30 DIAGNOSIS — M479 Spondylosis, unspecified: Secondary | ICD-10-CM

## 2015-09-30 LAB — BASIC METABOLIC PANEL
BUN: 14 mg/dL (ref 7–25)
CALCIUM: 9.6 mg/dL (ref 8.6–10.4)
CHLORIDE: 102 mmol/L (ref 98–110)
CO2: 26 mmol/L (ref 20–31)
CREATININE: 0.79 mg/dL (ref 0.50–0.99)
Glucose, Bld: 81 mg/dL (ref 65–99)
Potassium: 4.2 mmol/L (ref 3.5–5.3)
Sodium: 140 mmol/L (ref 135–146)

## 2015-09-30 LAB — POC MICROSCOPIC URINALYSIS (UMFC): Mucus: ABSENT

## 2015-09-30 LAB — POCT URINALYSIS DIP (MANUAL ENTRY)
Bilirubin, UA: NEGATIVE
GLUCOSE UA: NEGATIVE
Ketones, POC UA: NEGATIVE
Leukocytes, UA: NEGATIVE
Nitrite, UA: NEGATIVE
Protein Ur, POC: NEGATIVE
UROBILINOGEN UA: 0.2
pH, UA: 5.5

## 2015-09-30 MED ORDER — HYDROCODONE-ACETAMINOPHEN 5-325 MG PO TABS: 1.0000 | ORAL_TABLET | Freq: Three times a day (TID) | ORAL | 0 refills | Status: DC | PRN

## 2015-09-30 MED ORDER — GABAPENTIN 300 MG PO CAPS: 300.0000 mg | ORAL_CAPSULE | Freq: Three times a day (TID) | ORAL | 3 refills | Status: DC

## 2015-09-30 MED ORDER — CYCLOBENZAPRINE HCL 10 MG PO TABS: 10.0000 mg | ORAL_TABLET | Freq: Two times a day (BID) | ORAL | 0 refills | Status: DC | PRN

## 2015-09-30 MED ORDER — ATENOLOL 50 MG PO TABS: 50.0000 mg | ORAL_TABLET | Freq: Every day | ORAL | 5 refills | Status: DC

## 2015-09-30 MED ORDER — LISINOPRIL 20 MG PO TABS: 20.0000 mg | ORAL_TABLET | Freq: Every day | ORAL | 5 refills | Status: DC

## 2015-09-30 NOTE — Patient Instructions (Addendum)
     IF you received an x-ray today, you will receive an invoice from [facility]. Please contact [facility] at [phone] with questions or concerns regarding your invoice.   IF you received labwork today, you will receive an invoice from [employer]. Please contact [facility] at [phone] with questions or concerns regarding your invoice.   Our billing staff will not be able to assist you with questions regarding bills from these companies.  You will be contacted with the lab results as soon as they are available. The fastest way to get your results is to activate your My Chart account. Instructions are located on the last page of this paperwork. If you have not heard from [place] regarding the results in 2 weeks, please contact this office.    At this time, try the tylenol with food.  No more than 3000 during the day.  Do the tylenol as needed.   We need to find the culprit to this pain.  I am going to discuss your case with Dr. Tamala Julian.  If you wish to follow up with Dr. Edilia Bo, then let me know.

## 2015-09-30 NOTE — Progress Notes (Signed)
[facility] [address], [place] [address] [phone]  Date:  09/30/2015   Name:  Julie Lee   DOB:  03-Jan-1956   MRN:  [id number]  PCP:  Lamar Blinks, MD    History of Present Illness:  Julie Lee is a 60 y.o. female patient who presents to [facility] for medication refill of atenolol and lisinopril.  Wt Readings from Last 3 Encounters:  09/30/15 179 lb 3.2 oz (81.3 kg)  08/04/15 173 lb 3.2 oz (78.6 kg)  01/30/15 169 lb 9.6 oz (76.9 kg)       Patient Active Problem List   Diagnosis Date Noted  . HTN (hypertension) 08/23/2012  . OA (osteoarthritis) 08/23/2012  . Ulcerative colitis, unspecified 08/23/2012    Past Medical History:  Diagnosis Date  . Allergy   . Anemia   . Anxiety   . Arthritis   . Colitis   . Hypertension   . Kidney stones   . UTI (lower urinary tract infection)     Past Surgical History:  Procedure Laterality Date  . CESAREAN SECTION    . COLONOSCOPY    . TUBAL LIGATION      Social History  Substance Use Topics  . Smoking status: Never Smoker  . Smokeless tobacco: Never Used  . Alcohol use Yes     Comment: rarely    Family History  Problem Relation Age of Onset  . Heart disease Father   . Hyperlipidemia Mother   . Colon cancer Maternal Uncle 79  . Colitis Brother     had colectomy  . Diabetes Paternal Uncle   . Diabetes Paternal Grandfather   . Heart disease Paternal Grandfather   . Kidney disease Paternal Uncle   . Heart disease Maternal Grandfather     No Known Allergies  Medication list has been reviewed and updated.  Current Outpatient Prescriptions on File Prior to Visit  Medication Sig Dispense Refill  . atenolol (TENORMIN) 50 MG tablet Take 1 tablet (50 mg total) by mouth daily. 30 tablet 1  . balsalazide (COLAZAL) 750 MG capsule Take 3 capsules (2,250 mg total) by mouth 3 (three) times daily. 270 capsule 3  . cyclobenzaprine (FLEXERIL) 10 MG tablet Take 1 tablet (10 mg total) by  mouth 2 (two) times daily as needed for muscle spasms. 20 tablet 0  . diazepam (VALIUM) 10 MG tablet Take 1 tablet (10 mg total) by mouth every 12 (twelve) hours as needed for anxiety. 30 tablet 1  . gabapentin (NEURONTIN) 100 MG capsule Take 1 capsule (100 mg total) by mouth 3 (three) times daily. 90 capsule 3  . lisinopril (PRINIVIL,ZESTRIL) 20 MG tablet Take 1 tablet (20 mg total) by mouth daily. 30 tablet 1  . Pediatric Multiple Vitamins (CHEWABLE MULTIPLE VITAMINS PO) Take by mouth.    . traMADol (ULTRAM) 50 MG tablet Take 1-2 tablets (50-100 mg total) by mouth 2 (two) times daily. 120 tablet 3  . calcium carbonate (OS-CAL - DOSED IN MG OF ELEMENTAL CALCIUM) 1250 (500 Ca) MG tablet Take 1 tablet (500 mg of elemental calcium total) by mouth daily with breakfast. (Patient not taking: Reported on 09/30/2015) 30 tablet 11  . Cholecalciferol 2000 units TABS Take 1 tablet (2,000 Units total) by mouth daily. (Patient not taking: Reported on 09/30/2015) 30 each 11   No current facility-administered medications on file prior to visit.     ROS ROS otherwise unremarkable unless listed above.   Physical Examination: BP 116/72 (BP  Location: Right Arm, Patient Position: Sitting, Cuff Size: Normal)   Pulse 65   Temp 98.2 F (36.8 C) (Oral)   Resp 16   Ht 5' 5"  (1.651 m)   Wt 179 lb 3.2 oz (81.3 kg)   SpO2 95%   BMI 29.82 kg/m  Ideal Body Weight: Weight in (lb) to have BMI = 25: 149.9  Physical Exam  Results for orders placed or performed in visit on 09/30/15  POCT urinalysis dipstick  Result Value Ref Range   Color, UA yellow yellow   Clarity, UA clear clear   Glucose, UA negative negative   Bilirubin, UA negative negative   Ketones, POC UA negative negative   Spec Grav, UA <=1.005    Blood, UA moderate (A) negative   pH, UA 5.5    Protein Ur, POC negative negative   Urobilinogen, UA 0.2    Nitrite, UA Negative Negative   Leukocytes, UA Negative Negative  POCT Microscopic Urinalysis  ([facility])  Result Value Ref Range   WBC,UR,HPF,POC None None WBC/hpf   RBC,UR,HPF,POC None None RBC/hpf   Bacteria None None, Too numerous to count   Mucus Absent Absent   Epithelial Cells, UR Per Microscopy Few (A) None, Too numerous to count cells/hpf    Assessment and Plan: Julie Lee is a 60 y.o. female who is here today for medication refill. --given flexeril --refilled medications for htn.  Refill for 6 months.   Essential hypertension - Plan: Basic metabolic panel, Lipid panel, atenolol (TENORMIN) 50 MG tablet, lisinopril (PRINIVIL,ZESTRIL) 20 MG tablet  Cramp of both lower extremities - Plan: cyclobenzaprine (FLEXERIL) 10 MG tablet  Bilateral low back pain, with sciatica presence unspecified - Plan: cyclobenzaprine (FLEXERIL) 10 MG tablet  Ivar Drape, PA-C [facility] 09/30/2015 1:57 PM

## 2015-10-05 NOTE — Telephone Encounter (Signed)
Please contact patient and let her know that we will refill medication until October as far as the norco is concerned--for the next 3 months, while we await a pending ortho referral.  It would be best to consult with them as they may have other options at this time beside an MRI--especially if they do not need other imaging at this time.  Prior to visit let's make sure that the specialist have the imaging of your XRays to use in their evaluation.  Consult would be cheaper than the MRI.  If you are in agreement, we will proceed with that plan for late October?Marland KitchenMarland KitchenMarland Kitchen

## 2015-11-14 ENCOUNTER — Telehealth: Payer: Self-pay

## 2015-11-14 DIAGNOSIS — I10 Essential (primary) hypertension: Secondary | ICD-10-CM

## 2015-11-14 DIAGNOSIS — R252 Cramp and spasm: Secondary | ICD-10-CM

## 2015-11-14 DIAGNOSIS — M545 Low back pain: Secondary | ICD-10-CM

## 2015-11-14 NOTE — Telephone Encounter (Signed)
Pt states that she has been approved of a refill on norco for now to October but needs to be called in    Best number [phone]

## 2015-11-17 ENCOUNTER — Telehealth: Payer: Self-pay

## 2015-11-17 NOTE — Telephone Encounter (Signed)
Patient stated Julie Lee called her about 3/4 weeks ago about approving the medication Noro 5-325. Patient called on 11/14/2015 requesting for this medication. [phone]. Julie Lee stated to patient the medication will be filled through October. Patient doesn't understand what the hold up is.

## 2015-11-17 NOTE — Telephone Encounter (Signed)
Duplicate message. req has been routed to [place] already.

## 2015-11-19 ENCOUNTER — Other Ambulatory Visit: Payer: Self-pay | Admitting: Physician Assistant

## 2015-11-19 DIAGNOSIS — M545 Low back pain: Secondary | ICD-10-CM

## 2015-11-19 DIAGNOSIS — R252 Cramp and spasm: Secondary | ICD-10-CM

## 2015-11-19 DIAGNOSIS — I10 Essential (primary) hypertension: Secondary | ICD-10-CM

## 2015-11-19 MED ORDER — HYDROCODONE-ACETAMINOPHEN 5-325 MG PO TABS: 1.0000 | ORAL_TABLET | Freq: Every day | ORAL | 0 refills | Status: DC | PRN

## 2015-11-19 NOTE — Telephone Encounter (Signed)
Filled please alert patient to pick up

## 2015-11-20 NOTE — Telephone Encounter (Signed)
Per RX pickup book at the front desk, pt picked up RX today (11/20/2015).

## 2015-12-29 ENCOUNTER — Ambulatory Visit (INDEPENDENT_AMBULATORY_CARE_PROVIDER_SITE_OTHER): Payer: Self-pay | Admitting: Physician Assistant

## 2015-12-29 ENCOUNTER — Ambulatory Visit (INDEPENDENT_AMBULATORY_CARE_PROVIDER_SITE_OTHER): Payer: Self-pay

## 2015-12-29 VITALS — BP 122/72 | HR 78 | Temp 98.5°F | Resp 17 | Ht 64.5 in | Wt 177.0 lb

## 2015-12-29 DIAGNOSIS — R059 Cough, unspecified: Secondary | ICD-10-CM

## 2015-12-29 DIAGNOSIS — R05 Cough: Secondary | ICD-10-CM

## 2015-12-29 DIAGNOSIS — R509 Fever, unspecified: Secondary | ICD-10-CM

## 2015-12-29 LAB — POCT INFLUENZA A/B
INFLUENZA A, POC: NEGATIVE
Influenza B, POC: NEGATIVE

## 2015-12-29 LAB — POCT CBC
GRANULOCYTE PERCENT: 71.7 % (ref 37–80)
HEMATOCRIT: 39.7 % (ref 37.7–47.9)
Hemoglobin: 13.4 g/dL (ref 12.2–16.2)
Lymph, poc: 1.5 (ref 0.6–3.4)
MCH: 28.7 pg (ref 27–31.2)
MCHC: 33.9 g/dL (ref 31.8–35.4)
MCV: 84.7 fL (ref 80–97)
MID (cbc): 0.4 (ref 0–0.9)
MPV: 8.1 fL (ref 0–99.8)
PLATELET COUNT, POC: 292 10*3/uL (ref 142–424)
POC GRANULOCYTE: 5 (ref 2–6.9)
POC LYMPH %: 22.1 % (ref 10–50)
POC MID %: 6.2 %M (ref 0–12)
RBC: 4.69 M/uL (ref 4.04–5.48)
RDW, POC: 14 %
WBC: 7 10*3/uL (ref 4.6–10.2)

## 2015-12-29 MED ORDER — HYDROCODONE-HOMATROPINE 5-1.5 MG/5ML PO SYRP: 2.5000 mL | ORAL_SOLUTION | Freq: Every evening | ORAL | 0 refills | Status: DC | PRN

## 2015-12-29 MED ORDER — AZITHROMYCIN 250 MG PO TABS: ORAL_TABLET | ORAL | 0 refills | Status: DC

## 2015-12-29 NOTE — Progress Notes (Signed)
By signing my name below, I, Julie Lee, attest that this documentation has been prepared under the direction and in the presence of Julie Fendt, PA-C. Electronically Signed: Moises Lee, Scribe. 12/29/2015 , 3:39 PM .  Patient was seen in Room 3.  12/29/2015 5:05 PM   DOB: 1955/12/03 / MRN: [id number]  SUBJECTIVE:  Julie Lee is a 60 y.o. female presenting for cough with scratchy throat that started 4~5 days ago. She hasn't been able to cough anything up. She mentions having a headache, as well as measured fever over the past few days (tmax 100.9). She's taken mucinex, tylenol cold and sinus, alka-seltzer cold and sinus, cough suppressant robitussin, and excedrin. Her last dose of fever suppressant was at 12:00PM today. She denies chest pain or leg swelling. She denies history of smoking or asthma.   She has No Known Allergies.   She  has a past medical history of Allergy; Anemia; Anxiety; Arthritis; Colitis; Hypertension; Kidney stones; and UTI (lower urinary tract infection).    She  reports that she has never smoked. She has never used smokeless tobacco. She reports that she drinks alcohol. She reports that she does not use drugs. She  reports that she does not engage in sexual activity. The patient  has a past surgical history that includes Cesarean section; Tubal ligation; and Colonoscopy.  Her family history includes Colitis in her brother; Colon cancer (age of onset: 1) in her maternal uncle; Diabetes in her paternal grandfather and paternal uncle; Heart disease in her father, maternal grandfather, and paternal grandfather; Hyperlipidemia in her mother; Kidney disease in her paternal uncle.  Review of Systems  Constitutional: Positive for fever. Negative for malaise/fatigue.  HENT: Positive for sore throat.   Respiratory: Positive for cough. Negative for sputum production.   Cardiovascular: Negative for chest pain and leg swelling.  Neurological: Positive for  headaches. Negative for dizziness.    The problem list and medications were reviewed and updated by myself where necessary and exist elsewhere in the encounter.   OBJECTIVE:  BP 122/72 (BP Location: Right Arm, Patient Position: Sitting, Cuff Size: Normal)   Pulse 78   Temp 98.5 F (36.9 C) (Oral)   Resp 17   Ht 5' 4.5" (1.638 m)   Wt 177 lb (80.3 kg)   SpO2 96%   BMI 29.91 kg/m   Physical Exam  Constitutional: She is oriented to person, place, and time. Vital signs are normal. She appears well-developed.  Feels "clammy"   HENT:  Right Ear: Tympanic membrane normal.  Left Ear: Tympanic membrane normal.  Nose: Nose normal.  Mouth/Throat: Oropharynx is clear and moist.  Cardiovascular: Normal rate, regular rhythm and normal heart sounds.   No murmur heard. Pulmonary/Chest: Effort normal and breath sounds normal. No respiratory distress. She has no wheezes.  Musculoskeletal: Normal range of motion.  Lymphadenopathy:       Head (right side): No tonsillar adenopathy present.       Head (left side): No tonsillar adenopathy present.    She has no cervical adenopathy.  Neurological: She is alert and oriented to person, place, and time.  Skin: Skin is warm and dry.  Psychiatric: She has a normal mood and affect. Her behavior is normal. Judgment and thought content normal.    Results for orders placed or performed in visit on 12/29/15 (from the past 72 hour(s))  POCT CBC     Status: None   Collection Time: 12/29/15  4:03 PM  Result Value Ref Range  WBC 7.0 4.6 - 10.2 K/uL   Lymph, poc 1.5 0.6 - 3.4   POC LYMPH PERCENT 22.1 10 - 50 %L   MID (cbc) 0.4 0 - 0.9   POC MID % 6.2 0 - 12 %M   POC Granulocyte 5.0 2 - 6.9   Granulocyte percent 71.7 37 - 80 %G   RBC 4.69 4.04 - 5.48 M/uL   Hemoglobin 13.4 12.2 - 16.2 g/dL   HCT, POC 39.7 37.7 - 47.9 %   MCV 84.7 80 - 97 fL   MCH, POC 28.7 27 - 31.2 pg   MCHC 33.9 31.8 - 35.4 g/dL   RDW, POC 14.0 %   Platelet Count, POC 292 142 -  424 K/uL   MPV 8.1 0 - 99.8 fL  POCT Influenza A/B     Status: None   Collection Time: 12/29/15  4:17 PM  Result Value Ref Range   Influenza A, POC Negative Negative   Influenza B, POC Negative Negative    Dg Chest 2 View  Result Date: 12/29/2015 CLINICAL DATA:  Nonproductive cough with scratchy throat and fever beginning 4-5 days ago. EXAM: CHEST  2 VIEW COMPARISON:  None. FINDINGS: The cardiac silhouette is upper limits of normal in size. The lungs are hypoinflated with mild elevation of the right hemidiaphragm. No airspace consolidation, edema, pleural effusion, or pneumothorax is seen. 1.3 cm oval density projecting over the right clavicular head may represent an osseous lesion (such as bone island) or calcified granuloma in the lung. There is mild lower thoracic levoscoliosis. IMPRESSION: Hypoinflation without evidence of active cardiopulmonary disease. Electronically Signed   By: Logan Bores M.D.   On: 12/29/2015 15:51    ASSESSMENT AND PLAN  Julie Lee was seen today for cough and uri.  Diagnoses and all orders for this visit:  Cough: Her work up is negative.  I have advised that this is most likely a transient viral infection and will clear on its own.  She insisted on an antibiotic.  Advised that I will write only if she will agree to wait until day ten of total illness, and if she is not getting better by that time to go ahead and fill.  Hycodan QHS for now. Continue home remedies.  -     POCT CBC -     DG Chest 2 View; Future -     HYDROcodone-homatropine (HYCODAN) 5-1.5 MG/5ML syrup; Take 2.5-5 mLs by mouth at bedtime as needed.  Fever and chills -     POCT Influenza A/B -     azithromycin (ZITHROMAX) 250 MG tablet; Fill at day ten of illness total if you are not improving, or if you are getting worse.  Take two on day one and one daily thereafter.     The patient is advised to call or return to clinic if she does not see an improvement in symptoms, or to seek the care of  the closest emergency department if she worsens with the above plan.   This note was scribed in my presence and I performed the services described in the this documentation.   Julie Lee, MHS, PA-C [facility] 12/29/2015 5:05 PM

## 2015-12-29 NOTE — Patient Instructions (Signed)
     IF you received an x-ray today, you will receive an invoice from [facility]. Please contact [facility] at [phone] with questions or concerns regarding your invoice.   IF you received labwork today, you will receive an invoice from [facility]. Please contact [facility] at [phone] with questions or concerns regarding your invoice.   Our billing staff will not be able to assist you with questions regarding bills from these companies.  You will be contacted with the lab results as soon as they are available. The fastest way to get your results is to activate your My Chart account. Instructions are located on the last page of this paperwork. If you have not heard from us regarding the results in 2 weeks, please contact this office.      

## 2016-02-10 ENCOUNTER — Other Ambulatory Visit: Payer: Self-pay | Admitting: Family Medicine

## 2016-02-10 DIAGNOSIS — M545 Low back pain, unspecified: Secondary | ICD-10-CM

## 2016-02-10 NOTE — Telephone Encounter (Signed)
Last ov and refill with 3 additional 07/2015

## 2016-02-11 NOTE — Telephone Encounter (Signed)
Rx for Tramadol faxed to [place], Pt called to advise.

## 2016-03-15 ENCOUNTER — Other Ambulatory Visit: Payer: Self-pay | Admitting: Physician Assistant

## 2016-03-19 ENCOUNTER — Telehealth: Payer: Self-pay

## 2016-03-19 ENCOUNTER — Other Ambulatory Visit: Payer: Self-pay | Admitting: Emergency Medicine

## 2016-03-19 MED ORDER — GABAPENTIN 300 MG PO CAPS: 300.0000 mg | ORAL_CAPSULE | Freq: Three times a day (TID) | ORAL | 0 refills | Status: DC

## 2016-03-19 NOTE — Telephone Encounter (Signed)
This was filled for one month.  I notified the patient and told her she had to make an appointment before it would be filled again.

## 2016-03-19 NOTE — Telephone Encounter (Signed)
Pt's pharmacy sent in a refill request for gabapentin on Monday.  It looks like English refused due to the patient needed an appointment.  She called today checking the status because no one had told her or called her about this.  She asks that we fill this for one month due to her taking care of her aging mother.  She says she will make an appointment after that.  [phone]

## 2016-05-07 ENCOUNTER — Ambulatory Visit (INDEPENDENT_AMBULATORY_CARE_PROVIDER_SITE_OTHER): Payer: Self-pay | Admitting: Family Medicine

## 2016-05-07 VITALS — BP 120/70 | HR 72 | Temp 97.9°F | Resp 17 | Ht 64.5 in | Wt 177.0 lb

## 2016-05-07 DIAGNOSIS — R109 Unspecified abdominal pain: Secondary | ICD-10-CM

## 2016-05-07 DIAGNOSIS — F419 Anxiety disorder, unspecified: Secondary | ICD-10-CM

## 2016-05-07 DIAGNOSIS — R252 Cramp and spasm: Secondary | ICD-10-CM

## 2016-05-07 DIAGNOSIS — M8589 Other specified disorders of bone density and structure, multiple sites: Secondary | ICD-10-CM

## 2016-05-07 DIAGNOSIS — I10 Essential (primary) hypertension: Secondary | ICD-10-CM

## 2016-05-07 DIAGNOSIS — M545 Low back pain: Secondary | ICD-10-CM

## 2016-05-07 MED ORDER — DIAZEPAM 10 MG PO TABS: 10.0000 mg | ORAL_TABLET | Freq: Two times a day (BID) | ORAL | 0 refills | Status: DC | PRN

## 2016-05-07 MED ORDER — CYCLOBENZAPRINE HCL 10 MG PO TABS: 10.0000 mg | ORAL_TABLET | Freq: Two times a day (BID) | ORAL | 0 refills | Status: DC | PRN

## 2016-05-07 MED ORDER — TRAMADOL HCL 50 MG PO TABS: ORAL_TABLET | ORAL | 0 refills | Status: DC

## 2016-05-07 MED ORDER — ATENOLOL 50 MG PO TABS: 25.0000 mg | ORAL_TABLET | Freq: Every day | ORAL | 5 refills | Status: DC

## 2016-05-07 MED ORDER — LISINOPRIL 20 MG PO TABS: 20.0000 mg | ORAL_TABLET | Freq: Every day | ORAL | 5 refills | Status: DC

## 2016-05-07 MED ORDER — GABAPENTIN 300 MG PO CAPS: 300.0000 mg | ORAL_CAPSULE | Freq: Three times a day (TID) | ORAL | 5 refills | Status: DC

## 2016-05-07 NOTE — Progress Notes (Signed)
By signing my name below I, Tereasa Coop, attest that this documentation has been prepared under the direction and in the presence of Wendie Agreste, MD. Electonically Signed. Tereasa Coop, Scribe 05/07/2016 at 3:01 PM   Subjective:    Patient ID: Julie Lee, female    DOB: [birth year], 61 y.o.   MRN: [id number]  Chief Complaint  Patient presents with  . Medication Refill    on ALL medications listed  . Depression    per triage screening    HPI Julie Lee is a 61 y.o. female who presents to Primary Care at [facility] for medication refill on all her medications. During triage pt's depression screening was positive. Pt was last seen by her PCP, Ivar Drape in July 2017. Medications were refilled for 6 months at that time.  HTN Lab Results  Component Value Date   CREATININE 0.79 09/30/2015  Takes atenolol 69m QD. Lisinopril 246mQD. Plan was to [facility] months. Pt states that she occasionally gets lightheaded when she stands up to quickly. Denies any CP, SOB, blurred vision, hematuria, or double vision.   Low back pain/leg pain Today pt c/o bilat, lower back pain that radiates into her legs bilat. States she is not in pain right now. Pain was severe last night after working on her feet all day and reports that the back pain is a chronic issue. Denies any lower extremity weakness, bowel or urinary incontinence, or saddle anesthesia. Pt reports taking 4 of her tramadol a day to control her back pain. Per office visit with Ms [facility] 2017, planned to discuss with other provider and medications were refilled at that time. Given tramadol 120 tablets with 3 refills on 02/10/16. Given #20 flexeril in July. Also on #90 gabapentin 30063mrescribed TID. Low back pain was radiating down legs in office visit in July 2017. Had been referred to physical therapy and orthopedics in the past. Pt states that she only takes her flexeril when she is having muscle  spasm. Reports only taking 1 flexeril a week on average. Possible osteopenia on imaging. Had dexascan in march 2015 with T score of -2 at rt femoral neck. Also noticed osteopenia on other imaging, including chest xray in Oct 2017. Repeat Dexascan was not done due to cost since pt is uninsured. Pt had been recommended calcium and vit D supplement at her May 2017 visit. Note pt has had an MRI of her back due to cost. Pt's Lumbar xray on May 2017 showed mild possible compression fractures in T11 and T12 with scoliosis, diffuse multilevel degenerative change with 9mm33mift L4 on L5, and faint calcification over left mid flank as well as RUQ.   Pt states today that her low back pain sometimes radiates to her low abd. Denies abd pain now.Pt has a GI physician and has history of colitis. Pt states that she thinks she had a flare up of her colitis 3 months ago because she was having abd pain, blood in her stool, and bloating. Denies any vaginal bleeding. Pt started taking colazal which improved symptoms. Pt still taking colazal. States that prior to her colitis symptoms she was feeling lightheaded and started taking an iron supplement and her lightheadedness resolved. Pt noticed the blood in stool after she discontinued the iron supplement. Note that pt reports her maternal aunt had ovarian cancer and was c/o back pain radiating to her abd. Pt had a Ultrasound in March of 2015 which  showed no adnexal masses, rt ovary not visualized at that time.    Anxiety Takes 10gm valium once a week to control her stress. States that she lives with and takes care of her elderly mother. Note that pt never takes valium and flexeril at the same time.  Spoke with patient about depression screening.  Has a lot on her shoulders with care of parent, stress with having to work.  Not as social as used to be, more isolated as taking care of mother.  Denies anhedonia, but just not having the energy.  Not really sure if she feels depressed or  not. Feels like she has lost joy past year. Not really as involved  Was treated with antidepressant (zoloft) about 25 years ago, but felt worse on meds, including feeling homicidal/suicidal, weight gain with meds as well.  Swore to not take those meds again.    Depression screen [facility] 2/9 05/07/2016 12/29/2015 09/30/2015 08/04/2015 01/30/2015  Decreased Interest 3 0 0 0 0  Down, Depressed, Hopeless 2 0 0 0 0  PHQ - 2 Score 5 0 0 0 0  Altered sleeping 3 - - - -  Tired, decreased energy 3 - - - -  Change in appetite 3 - - - -  Feeling bad or failure about yourself  3 - - - -  Trouble concentrating 3 - - - -  Moving slowly or fidgety/restless 0 - - - -  Suicidal thoughts 0 - - - -  PHQ-9 Score 20 - - - -  Difficult doing work/chores Somewhat difficult - - - -    Previous records reviewed.     Patient Active Problem List   Diagnosis Date Noted  . HTN (hypertension) 08/23/2012  . OA (osteoarthritis) 08/23/2012  . Ulcerative colitis, unspecified 08/23/2012   Past Medical History:  Diagnosis Date  . Allergy   . Anemia   . Anxiety   . Arthritis   . Colitis   . Hypertension   . Kidney stones   . UTI (lower urinary tract infection)    Past Surgical History:  Procedure Laterality Date  . CESAREAN SECTION    . COLONOSCOPY    . TUBAL LIGATION     No Known Allergies Prior to Admission medications   Medication Sig Start Date End Date Taking? Authorizing Provider  atenolol (TENORMIN) 50 MG tablet Take 1 tablet (50 mg total) by mouth daily. 09/30/15  Yes Dorian Heckle English, PA  balsalazide (COLAZAL) 750 MG capsule Take 3 capsules (2,250 mg total) by mouth 3 (three) times daily. 07/04/14  Yes Milus Banister, MD  cyclobenzaprine (FLEXERIL) 10 MG tablet Take 1 tablet (10 mg total) by mouth 2 (two) times daily as needed for muscle spasms. 09/30/15  Yes Stephanie D English, PA  diazepam (VALIUM) 10 MG tablet Take 1 tablet (10 mg total) by mouth every 12 (twelve) hours as needed for anxiety.  08/04/15  Yes Dorian Heckle English, PA  gabapentin (NEURONTIN) 300 MG capsule Take 1 capsule (300 mg total) by mouth 3 (three) times daily. Pt needs an appointment for further refills 03/19/2016 03/19/16  Yes Stephanie D English, PA  lisinopril (PRINIVIL,ZESTRIL) 20 MG tablet Take 1 tablet (20 mg total) by mouth daily. 09/30/15  Yes Dorian Heckle English, PA  Pediatric Multiple Vitamins (CHEWABLE MULTIPLE VITAMINS PO) Take by mouth.   Yes Historical Provider, MD  traMADol (ULTRAM) 50 MG tablet TAKE ONE TO TWO TABLETS BY MOUTH TWICE DAILY 02/10/16  Yes Joretta Bachelor, PA  Social History   Social History  . Marital status: Single    Spouse name: N/A  . Number of children: N/A  . Years of education: N/A   Occupational History  . Not on file.   Social History Main Topics  . Smoking status: Never Smoker  . Smokeless tobacco: Never Used  . Alcohol use Yes     Comment: rarely  . Drug use: No  . Sexual activity: No   Other Topics Concern  . Not on file   Social History Narrative  . No narrative on file      Review of Systems  Constitutional: Negative for fatigue and unexpected weight change.  Eyes: Negative for visual disturbance.  Respiratory: Negative for chest tightness and shortness of breath.   Cardiovascular: Negative for chest pain, palpitations and leg swelling.  Gastrointestinal: Positive for abdominal pain (with bloating, no pain currently) and blood in stool.  Genitourinary: Negative for vaginal bleeding.  Musculoskeletal: Positive for back pain (low back pain).  Skin: Negative for rash.  Neurological: Positive for light-headedness. Negative for dizziness, syncope and headaches.  Psychiatric/Behavioral: The patient is nervous/anxious (increased stress from caring for her mother).        Objective:   Physical Exam  Constitutional: She is oriented to person, place, and time. She appears well-developed and well-nourished.  HENT:  Head: Normocephalic and atraumatic.    Eyes: Conjunctivae and EOM are normal. Pupils are equal, round, and reactive to light.  Neck: Carotid bruit is not present.  Cardiovascular: Normal rate, regular rhythm, normal heart sounds and intact distal pulses.   Pulmonary/Chest: Effort normal and breath sounds normal.  Abdominal: Soft. She exhibits no pulsatile midline mass. There is no tenderness.  Musculoskeletal: She exhibits no edema.  Neurological: She is alert and oriented to person, place, and time.  Reflex Scores:      Patellar reflexes are 2+ on the right side and 2+ on the left side.      Achilles reflexes are 2+ on the right side and 2+ on the left side. Skin: Skin is warm and dry.  Psychiatric: She has a normal mood and affect. Her behavior is normal.  Vitals reviewed.    Vitals:   05/07/16 1345  BP: 120/70  Pulse: 72  Resp: 17  Temp: 97.9 F (36.6 C)  TempSrc: Oral  SpO2: 96%  Weight: 177 lb (80.3 kg)  Height: 5' 4.5" (1.638 m)         Assessment & Plan:   Julie Lee is a 61 y.o. female Osteopenia of multiple sites - Plan: Vitamin D, 25-hydroxy  - Osteopenia by prior DEXA scan. Repeat test the scan has been cost prohibitive. Check vitamin D level, continue over-the-counter calcium and vitamin D supplementation.  Anxiety - Plan: diazepam (VALIUM) 10 MG tablet  - Possible anxiety with depression symptoms. See above pH Q and discussion on phone after visit. Some components of anhedonia, and may be in part due to stress with situational stressors at home, chronic pain.   -Did not tolerate Zoloft in past with SI/HI. Could consider other SSRI with close follow up.  Will defer to PCP.   -Encouraged to get out of the house at least once a week with a friend or other social setting, and could consider counseling.   - Valium refilled temporarily for situational anxiety, additive side effects with other sedating medicines discussed, and further refills from PCP.  Essential hypertension - Plan: lisinopril  (PRINIVIL,ZESTRIL) 20 MG tablet, Basic metabolic  panel, atenolol (TENORMIN) 50 MG tablet  - Controlled. Episodic orthostatic symptoms. Recommended half dose of atenolol, then if BP elevates, consider adding HCTZ. Continued atenolol currently with history of headaches, but has not experienced migraine headaches recently. BMP obtained.   Bilateral low back pain, unspecified chronicity, with sciatica presence unspecified - Plan: cyclobenzaprine (FLEXERIL) 10 MG tablet, gabapentin (NEURONTIN) 300 MG capsule, traMADol (ULTRAM) 50 MG tablet Cramp of both lower extremities - Plan: cyclobenzaprine (FLEXERIL) 10 MG tablet  - Long-standing low back pain, osteopenia with possible compression fracture seen on prior imaging. Ideally MRI may be helpful, or evaluation with specialist, but cost prohibitive at this time. Agreed to continue tramadol, 1 month prescription given consistent with controlled substance policy and to follow-up with primary care provider to discuss further refills. Continue gabapentin, and Flexeril if needed for breakthrough pain. Caution with other sedating medications  Abdominal pain, unspecified abdominal location  - May be related to history of colitis, but also recommended ultrasound to evaluate abdominal bloating and discomfort further and to look at uterus/ovaries.  Cost prohibitive at this point, so declined, but can discuss further workup with her primary care provider and sx's with her gastroenterologist.   Meds ordered this encounter  Medications  . cyclobenzaprine (FLEXERIL) 10 MG tablet    Sig: Take 1 tablet (10 mg total) by mouth 2 (two) times daily as needed for muscle spasms.    Dispense:  20 tablet    Refill:  0  . diazepam (VALIUM) 10 MG tablet    Sig: Take 1 tablet (10 mg total) by mouth every 12 (twelve) hours as needed for anxiety.    Dispense:  30 tablet    Refill:  0  . gabapentin (NEURONTIN) 300 MG capsule    Sig: Take 1 capsule (300 mg total) by mouth 3 (three)  times daily.    Dispense:  90 capsule    Refill:  5  . lisinopril (PRINIVIL,ZESTRIL) 20 MG tablet    Sig: Take 1 tablet (20 mg total) by mouth daily.    Dispense:  30 tablet    Refill:  5  . atenolol (TENORMIN) 50 MG tablet    Sig: Take 0.5-1 tablets (25-50 mg total) by mouth daily.    Dispense:  30 tablet    Refill:  5  . traMADol (ULTRAM) 50 MG tablet    Sig: TAKE ONE TO TWO TABLETS BY MOUTH TWICE DAILY    Dispense:  120 tablet    Refill:  0   Patient Instructions    For your abdominal pain and bloating and family history of ovarian cancer, I would recommend a repeat abdominal ultrasound. Let me know if you would like me to order that test.  You can also discuss these symptoms with your gastroenterologist with your history of colitis, and follow up with Ms. English to discuss the plan further.   If you have lightheadedness, I would recommend decreasing your atenolol to 1/2 per pill. If blood pressure elevates past 140/90 on 1/2 dose, would recommend changing lisinopril dose or adding different medication. This can also be discussed with your primary provider.   For your back pain, I can refill your medications temporarily, but those medications will need to be refilled and discussed with your primary care provider. Valium will also need to be refilled by your primary provider next time. Please discuss plan for back pain with your primary provider.   I will check your vitamin D level with low bone mass, but continue  calcium 1200-1516m per day, and vitamin D 800 to 1000 units per day.   [facility] Policy for Prescribing Controlled Substances 1. Prescriptions for controlled substances will be filled by ONE [facility] you have established and developed a plan for your care, including follow-up. 2. You are encouraged to schedule an appointment with your prescriber at our appointment center for follow-up visits whenever possible. 3. If you [facility] a [facility] for the  controlled substance [facility] (with someone other than your regular prescriber), you MAY be given a ONE-TIME prescription for a 30-day supply of the controlled substance, to allow time for you to return to see your regular prescriber for additional prescriptions.   IF you received an x-ray today, you will [facility]. Please contact [facility] at [phone] questions or concerns regarding your invoice.   IF you received labwork today, you will receive an invoice from [facility] Please contact [facility] at [phone] questions or concerns regarding your invoice.   Our billing staff will not be able to assist you with questions regarding bills from these companies.  You will be contacted with the lab results as soon as they are available. The fastest way to get your results is to activate your My Chart account. Instructions are located on the last page of this paperwork. If you have not heard from [facility] the results in 2 weeks, please contact this office.        I personally performed the services described in this documentation, which was scribed in my presence. The recorded information has been reviewed and considered for accuracy and completeness, addended by me as needed, and agree with information above.  Signed,   JMerri Ray MD [facility]  05/07/16 5:43 PM

## 2016-05-07 NOTE — Patient Instructions (Addendum)
  For your abdominal pain and bloating and family history of ovarian cancer, I would recommend a repeat abdominal ultrasound. Let me know if you would like me to order that test.  You can also discuss these symptoms with your gastroenterologist with your history of colitis, and follow up with Ms. English to discuss the plan further.   If you have lightheadedness, I would recommend decreasing your atenolol to 1/2 per pill. If blood pressure elevates past 140/90 on 1/2 dose, would recommend changing lisinopril dose or adding different medication. This can also be discussed with your primary provider.   For your back pain, I can refill your medications temporarily, but those medications will need to be refilled and discussed with your primary care provider. Valium will also need to be refilled by your primary provider next time. Please discuss plan for back pain with your primary provider.   I will check your vitamin D level with low bone mass, but continue calcium 1200-1539m per day, and vitamin D 800 to 1000 units per day.   [facility] Policy for Prescribing Controlled Substances 1. Prescriptions for controlled substances will be filled by ONE [facility] whom you have established and developed a plan for your care, including follow-up. 2. You are encouraged to schedule an appointment with your prescriber at our appointment center for follow-up visits whenever possible. 3. If you [facility] a [facility] for the controlled substance [facility] (with someone other than your regular prescriber), you MAY be given a ONE-TIME prescription for a 30-day supply of the controlled substance, to allow time for you to return to see your regular prescriber for additional prescriptions.   IF you received an x-ray today, you will receive an invoice from [facility]. Please contact [facility] at [phone] questions or concerns regarding your invoice.   IF you  received labwork today, you will receive an invoice from [facility] Please contact [facility] at [phone] questions or concerns regarding your invoice.   Our billing staff will not be able to assist you with questions regarding bills from these companies.  You will be contacted with the lab results as soon as they are available. The fastest way to get your results is to activate your My Chart account. Instructions are located on the last page of this paperwork. If you have not heard from [facility] the results in 2 weeks, please contact this office.

## 2016-05-09 LAB — BASIC METABOLIC PANEL
BUN/Creatinine Ratio: 17 (ref 12–28)
BUN: 13 mg/dL (ref 8–27)
CALCIUM: 10 mg/dL (ref 8.7–10.3)
CHLORIDE: 102 mmol/L (ref 96–106)
CO2: 23 mmol/L (ref 18–29)
CREATININE: 0.77 mg/dL (ref 0.57–1.00)
GFR calc Af Amer: 95 mL/min/{1.73_m2} (ref >59)
GFR calc non Af Amer: 82 mL/min/{1.73_m2} (ref >59)
GLUCOSE: 97 mg/dL (ref 65–99)
Potassium: 4.7 mmol/L (ref 3.5–5.2)
Sodium: 143 mmol/L (ref 134–144)

## 2016-05-09 LAB — VITAMIN D 25 HYDROXY (VIT D DEFICIENCY, FRACTURES): VIT D 25 HYDROXY: 47.5 ng/mL (ref 30.0–100.0)

## 2016-05-18 ENCOUNTER — Telehealth: Payer: Self-pay | Admitting: Family Medicine

## 2016-05-18 NOTE — Telephone Encounter (Signed)
See my chart result, all nl. She will continue on her current vitamin regimen unless you want her on an rx?

## 2016-05-18 NOTE — Telephone Encounter (Signed)
PATIENT SAW DR. Carlota Raspberry LAST Saturday TO GET MEDICATION REFILLS AND HE ORDERED SOME BLOOD WORK TO CHECK HER VITAMIN D LEVEL. SHE RECEIVED NOTICE THAT THE RESULTS WAS ON "MY-CHART" BUT SHE IS NOT ABLE TO PULL IT UP. SHE WOULD LIKE [place] TO GIVE HER THE RESULTS PLEASE. BEST PHONE [phone] (CELL) PHARMACY CHOICE IS [facility] [address]. Julie Lee

## 2016-05-18 NOTE — Telephone Encounter (Signed)
She is fine to continue over-the-counter vitamin supplements. Would recommend 1200-1500 mg of calcium per day, and at least [phone] units of vitamin D per day.

## 2016-05-30 ENCOUNTER — Emergency Department (HOSPITAL_COMMUNITY): Payer: Self-pay

## 2016-05-30 ENCOUNTER — Encounter (HOSPITAL_COMMUNITY): Payer: Self-pay | Admitting: Emergency Medicine

## 2016-05-30 ENCOUNTER — Emergency Department (HOSPITAL_COMMUNITY)
Admission: EM | Admit: 2016-05-30 | Discharge: 2016-05-31 | Disposition: A | Discharge status: Home / Self Care | Payer: Self-pay | Attending: Emergency Medicine | Admitting: Emergency Medicine

## 2016-05-30 DIAGNOSIS — Y93K1 Activity, walking an animal: Secondary | ICD-10-CM | POA: Insufficient documentation

## 2016-05-30 DIAGNOSIS — I10 Essential (primary) hypertension: Secondary | ICD-10-CM | POA: Insufficient documentation

## 2016-05-30 DIAGNOSIS — S53125A Posterior dislocation of left ulnohumeral joint, initial encounter: Secondary | ICD-10-CM | POA: Insufficient documentation

## 2016-05-30 DIAGNOSIS — Y929 Unspecified place or not applicable: Secondary | ICD-10-CM | POA: Insufficient documentation

## 2016-05-30 DIAGNOSIS — Z79899 Other long term (current) drug therapy: Secondary | ICD-10-CM | POA: Insufficient documentation

## 2016-05-30 DIAGNOSIS — W1839XA Other fall on same level, initial encounter: Secondary | ICD-10-CM | POA: Insufficient documentation

## 2016-05-30 DIAGNOSIS — S53105A Unspecified dislocation of left ulnohumeral joint, initial encounter: Secondary | ICD-10-CM

## 2016-05-30 DIAGNOSIS — Y999 Unspecified external cause status: Secondary | ICD-10-CM | POA: Insufficient documentation

## 2016-05-30 MED ORDER — PROPOFOL 10 MG/ML IV BOLUS
INTRAVENOUS | Status: AC | PRN
Start: 2016-05-30 — End: 2016-05-30
  Administered 2016-05-30: 80 mg via INTRAVENOUS
  Administered 2016-05-30: 20 mg via INTRAVENOUS
  Administered 2016-05-30: 30 mg via INTRAVENOUS
  Administered 2016-05-30: 60 mg via INTRAVENOUS

## 2016-05-30 MED ORDER — PROPOFOL 10 MG/ML IV BOLUS
0.5000 mg/kg | Freq: Once | INTRAVENOUS | Status: DC
  Filled 2016-05-30: qty 20

## 2016-05-30 MED ORDER — PROPOFOL 10 MG/ML IV BOLUS
INTRAVENOUS | Status: AC
  Filled 2016-05-30: qty 20

## 2016-05-30 MED ORDER — HYDROMORPHONE HCL 1 MG/ML IJ SOLN
1.0000 mg | Freq: Once | INTRAMUSCULAR | Status: AC
  Administered 2016-05-30: 1 mg via INTRAVENOUS
  Filled 2016-05-30: qty 1

## 2016-05-30 MED ORDER — HYDROMORPHONE HCL 1 MG/ML IJ SOLN
0.5000 mg | Freq: Once | INTRAMUSCULAR | Status: AC
Start: 2016-05-30 — End: 2016-05-30
  Administered 2016-05-30: 0.5 mg via INTRAVENOUS
  Filled 2016-05-30: qty 0.5

## 2016-05-30 MED ORDER — OXYCODONE-ACETAMINOPHEN 5-325 MG PO TABS
2.0000 | ORAL_TABLET | Freq: Once | ORAL | Status: AC
  Administered 2016-05-30: 2 via ORAL
  Filled 2016-05-30: qty 2

## 2016-05-30 MED ORDER — FENTANYL CITRATE (PF) 100 MCG/2ML IJ SOLN
50.0000 ug | INTRAMUSCULAR | Status: DC | PRN
  Administered 2016-05-30: 50 ug via INTRAVENOUS
  Filled 2016-05-30: qty 2

## 2016-05-30 MED ORDER — ONDANSETRON HCL 4 MG/2ML IJ SOLN
4.0000 mg | Freq: Once | INTRAMUSCULAR | Status: AC | PRN
  Administered 2016-05-30: 4 mg via INTRAVENOUS
  Filled 2016-05-30: qty 2

## 2016-05-30 NOTE — Sedation Documentation (Signed)
NRB applied by RT.

## 2016-05-31 MED ORDER — DOCUSATE SODIUM 100 MG PO CAPS: 100.0000 mg | ORAL_CAPSULE | Freq: Two times a day (BID) | ORAL | 0 refills | Status: DC

## 2016-05-31 MED ORDER — OXYCODONE-ACETAMINOPHEN 5-325 MG PO TABS: 2.0000 | ORAL_TABLET | ORAL | 0 refills | Status: DC | PRN

## 2016-05-31 NOTE — ED Provider Notes (Signed)
[facility] DEPT Provider Note   CSN: [id number] Arrival date & time: 05/30/16  2043     History   Chief Complaint Chief Complaint  Patient presents with  . Arm Injury    HPI Julie Lee is a 61 y.o. female.  HPI Patient was walking her dog and fell. She reports it all happened so fast she doesn't even know how she ended up falling. She reports she landed on her left arm. She did not hit her head, has no headache no other focal weakness numbness or tingling. She reports that when she went to get up from the ground she realized that her left arm was extremely painful and she couldn't move it. Past Medical History:  Diagnosis Date  . Allergy   . Anemia   . Anxiety   . Arthritis   . Colitis   . Hypertension   . Kidney stones   . UTI (lower urinary tract infection)     Patient Active Problem List   Diagnosis Date Noted  . HTN (hypertension) 08/23/2012  . OA (osteoarthritis) 08/23/2012  . Ulcerative colitis, unspecified 08/23/2012    Past Surgical History:  Procedure Laterality Date  . CESAREAN SECTION    . COLONOSCOPY    . TUBAL LIGATION      OB History    No data available       Home Medications    Prior to Admission medications   Medication Sig Start Date End Date Taking? Authorizing Provider  atenolol (TENORMIN) 50 MG tablet Take 0.5-1 tablets (25-50 mg total) by mouth daily. Patient taking differently: Take 25 mg by mouth at bedtime.  05/07/16  Yes Wendie Agreste, MD  balsalazide (COLAZAL) 750 MG capsule Take 3 capsules (2,250 mg total) by mouth 3 (three) times daily. Patient taking differently: Take 2,250 mg by mouth 3 (three) times daily as needed (flare up).  07/04/14  Yes Milus Banister, MD  cyclobenzaprine (FLEXERIL) 10 MG tablet Take 1 tablet (10 mg total) by mouth 2 (two) times daily as needed for muscle spasms. 05/07/16  Yes Wendie Agreste, MD  diazepam (VALIUM) 10 MG tablet Take 1 tablet (10 mg total) by mouth every 12 (twelve) hours as  needed for anxiety. 05/07/16  Yes Wendie Agreste, MD  gabapentin (NEURONTIN) 300 MG capsule Take 1 capsule (300 mg total) by mouth 3 (three) times daily. Patient taking differently: Take 300 mg by mouth 3 (three) times daily as needed (pain).  05/07/16  Yes Wendie Agreste, MD  lisinopril (PRINIVIL,ZESTRIL) 20 MG tablet Take 1 tablet (20 mg total) by mouth daily. Patient taking differently: Take 20 mg by mouth at bedtime.  05/07/16  Yes Wendie Agreste, MD  Multiple Vitamins-Minerals (MULTIVITAMIN GUMMIES ADULT) CHEW Chew 1 tablet by mouth daily.   Yes Historical Provider, MD  traMADol (ULTRAM) 50 MG tablet TAKE ONE TO TWO TABLETS BY MOUTH TWICE DAILY Patient taking differently: Take 50-100 mg by mouth 2 (two) times daily. 100 mg every morning and 50 mg every afternoon 05/07/16  Yes Wendie Agreste, MD  docusate sodium (COLACE) 100 MG capsule Take 1 capsule (100 mg total) by mouth every 12 (twelve) hours. 05/31/16   Charlesetta Shanks, MD  oxyCODONE-acetaminophen (PERCOCET) 5-325 MG tablet Take 2 tablets by mouth every 4 (four) hours as needed. 05/31/16   Charlesetta Shanks, MD    Family History Family History  Problem Relation Age of Onset  . Heart disease Father   . Hyperlipidemia Mother   .  Colon cancer Maternal Uncle 29  . Colitis Brother     had colectomy  . Diabetes Paternal Uncle   . Diabetes Paternal Grandfather   . Heart disease Paternal Grandfather   . Kidney disease Paternal Uncle   . Heart disease Maternal Grandfather     Social History Social History  Substance Use Topics  . Smoking status: Never Smoker  . Smokeless tobacco: Never Used  . Alcohol use Yes     Comment: rarely     Allergies   Patient has no known allergies.   Review of Systems Review of Systems 10 Systems reviewed and are negative for acute change except as noted in the HPI.   Physical Exam Updated Vital Signs BP (!) 121/59   Pulse 68   Temp 98.1 F (36.7 C) (Oral)   Resp 12   Wt 177 lb 0.5 oz (80.3  kg)   SpO2 97%   BMI 29.92 kg/m   Physical Exam  Constitutional: She is oriented to person, place, and time. She appears well-developed and well-nourished. No distress.  Patient appears be in pain but is alert and appropriate. Cooperative without respiratory distress.  HENT:  Head: Normocephalic and atraumatic.  Nose: Nose normal.  Mouth/Throat: Oropharynx is clear and moist.  Eyes: Conjunctivae and EOM are normal. Pupils are equal, round, and reactive to light.  Neck: Neck supple.  Cardiovascular: Normal rate, regular rhythm, normal heart sounds and intact distal pulses.   No murmur heard. Pulmonary/Chest: Effort normal and breath sounds normal. No respiratory distress.  Abdominal: Soft. There is no tenderness.  Musculoskeletal: She exhibits no edema.  Large swelling over the elbow and lateral forearm on the left. Pain with any range of motion. Radial pulse normal and intact. Movement of digits intact. Other extremities have normal range of motion without deformity.  Neurological: She is alert and oriented to person, place, and time. No cranial nerve deficit. She exhibits normal muscle tone. Coordination normal.  Skin: Skin is warm and dry.  Psychiatric: She has a normal mood and affect.  Nursing note and vitals reviewed.    ED Treatments / Results  Labs (all labs ordered are listed, but only abnormal results are displayed) Labs Reviewed - No data to display  EKG  EKG Interpretation None       Radiology Dg Elbow 2 Views Left  Result Date: 05/30/2016 CLINICAL DATA:  Postreduction. EXAM: LEFT ELBOW - 2 VIEW COMPARISON:  Pre reduction exam earlier this day. FINDINGS: Lateral image labeled attempted #1 demonstrates persistent posterior dislocation of the radius and ulna. AP image labeled attempt #1 demonstrates improved alignment with periarticular fracture fragments about the medial and lateral humeral epicondyles on fracture of the olecranon. Image labeled attempt #2 shows  appearing reduction of the posterior radius and ulnar dislocation. Fracture fragments on prior exam are not well visualized. There is a joint effusion. IMPRESSION: Improved alignment of posterior elbow dislocation after attempt #2. Peri-articular fracture fragments about the medial and lateral humeral epicondyles, donor site uncertain, with anterior olecranon fracture definitively visualized. Electronically Signed   By: Jeb Levering M.D.   On: 05/30/2016 23:46   Dg Elbow Complete Left  Result Date: 05/30/2016 CLINICAL DATA:  Fall onto left elbow while walking her dog this evening, swelling and bruising. EXAM: LEFT ELBOW - COMPLETE 3+ VIEW COMPARISON:  None. FINDINGS: Posterior dislocation of the radius and ulna with respect to the distal humerus. Small fracture fragments are seen about the volar surface, donor site uncertain, however suspect from  the olecranon. There is an elbow joint effusion. IMPRESSION: Posterior dislocation of the radius and ulna with small fracture fragments, likely from the olecranon. Electronically Signed   By: Jeb Levering M.D.   On: 05/30/2016 21:31    Procedures .Sedation Date/Time: 05/31/2016 12:07 AM Performed by: Charlesetta Shanks Authorized by: Charlesetta Shanks   Consent:    Consent obtained:  Verbal   Consent given by:  Patient   Risks discussed:  Allergic reaction, dysrhythmia, inadequate sedation, nausea, prolonged hypoxia resulting in organ damage, prolonged sedation necessitating reversal, respiratory compromise necessitating ventilatory assistance and intubation and vomiting Indications:    Procedure performed:  Dislocation reduction   Procedure necessitating sedation performed by:  Physician performing sedation   Intended level of sedation:  Moderate (conscious sedation) Pre-sedation assessment:    ASA classification: class 1 - normal, healthy patient     Neck mobility: normal     Mouth opening:  3 or more finger widths   Mallampati score:  I - soft  palate, uvula, fauces, pillars visible   Pre-sedation assessments completed and reviewed: airway patency, cardiovascular function, hydration status, mental status, nausea/vomiting, pain level and respiratory function     History of difficult intubation: no     Pre-sedation assessment completed:  05/31/2016 9:50 AM Procedure details (see MAR for exact dosages):    Sedation start time:  05/31/2016 10:10 AM   Preoxygenation:  Nasal cannula   Sedation:  Propofol   Analgesia:  Hydromorphone   Intra-procedure monitoring:  Blood pressure monitoring, cardiac monitor, continuous pulse oximetry, continuous capnometry, frequent LOC assessments and frequent vital sign checks   Intra-procedure events: none     Intra-procedure management:  Airway repositioning and BVM ventilation   Sedation end time:  05/31/2016 10:40 AM   Total sedation time (minutes):  15 Comments:     Patient's elbow had significant laxity and deformity. Several attempts required to reduce. Several additional doses of Diprovan needed on a PRN for repeat attempt at reduction after first did not adequately reduce the elbow.  Reduction of dislocation Date/Time: 05/31/2016 12:23 AM Performed by: Charlesetta Shanks Authorized by: Charlesetta Shanks  Consent: Verbal consent obtained. Consent given by: patient Patient understanding: patient states understanding of the procedure being performed Local anesthesia used: no  Anesthesia: Local anesthesia used: no  Sedation: Patient sedated: yes Sedation type: moderate (conscious) sedation Sedatives: propofol  Comments: Record used with positioning and traction. Splint applied with [employer] after procedure completed.    (including critical care time)  Medications Ordered in ED Medications  fentaNYL (SUBLIMAZE) injection 50 mcg (50 mcg Intravenous Given 05/30/16 2106)  propofol (DIPRIVAN) 10 mg/mL bolus/IV push 40.2 mg (40.2 mg Intravenous See Procedure Record 05/30/16 2343)  ondansetron  [facility]) injection 4 mg (4 mg Intravenous Given 05/30/16 2111)  HYDROmorphone (DILAUDID) injection 1 mg (1 mg Intravenous Given 05/30/16 2140)  propofol (DIPRIVAN) 10 mg/mL bolus/IV push (60 mg Intravenous Given 05/30/16 2243)  HYDROmorphone (DILAUDID) injection 0.5 mg (0.5 mg Intravenous Given 05/30/16 2237)  oxyCODONE-acetaminophen (PERCOCET/ROXICET) 5-325 MG per tablet 2 tablet (2 tablets Oral Given 05/30/16 2347)     Initial Impression / Assessment and Plan / ED Course  I have reviewed the triage vital signs and the nursing notes.  Pertinent labs & imaging results that were available during my care of the patient were reviewed by me and considered in my medical decision making (see chart for details).    Consult: Dr.Xu orthopedics. Follow the patient up in the office.  Final Clinical Impressions(s) /  ED Diagnoses   Final diagnoses:  Dislocation of left elbow, initial encounter   Patient with dislocation after fall. No other associated injury. Elbow reduced and splinted. Follow-up plan in place. Patient was given Percocet orally for pain control prior to discharge. She reports feeling comfortable in her splint. New Prescriptions New Prescriptions   DOCUSATE SODIUM (COLACE) 100 MG CAPSULE    Take 1 capsule (100 mg total) by mouth every 12 (twelve) hours.   OXYCODONE-ACETAMINOPHEN (PERCOCET) 5-325 MG TABLET    Take 2 tablets by mouth every 4 (four) hours as needed.     Charlesetta Shanks, MD 05/31/16 [phone]

## 2016-06-02 ENCOUNTER — Encounter (INDEPENDENT_AMBULATORY_CARE_PROVIDER_SITE_OTHER): Payer: Self-pay | Admitting: Orthopaedic Surgery

## 2016-06-02 ENCOUNTER — Ambulatory Visit (HOSPITAL_COMMUNITY)
Admission: RE | Admit: 2016-06-02 | Discharge: 2016-06-02 | Disposition: A | Discharge status: Home / Self Care | Payer: Self-pay | Source: Ambulatory Visit | Attending: Orthopaedic Surgery | Admitting: Orthopaedic Surgery

## 2016-06-02 ENCOUNTER — Ambulatory Visit (INDEPENDENT_AMBULATORY_CARE_PROVIDER_SITE_OTHER): Payer: Self-pay | Admitting: Orthopaedic Surgery

## 2016-06-02 DIAGNOSIS — S83422A Sprain of lateral collateral ligament of left knee, initial encounter: Secondary | ICD-10-CM | POA: Insufficient documentation

## 2016-06-02 DIAGNOSIS — S42402A Unspecified fracture of lower end of left humerus, initial encounter for closed fracture: Secondary | ICD-10-CM

## 2016-06-02 DIAGNOSIS — S52042A Displaced fracture of coronoid process of left ulna, initial encounter for closed fracture: Secondary | ICD-10-CM | POA: Insufficient documentation

## 2016-06-02 DIAGNOSIS — M25422 Effusion, left elbow: Secondary | ICD-10-CM | POA: Insufficient documentation

## 2016-06-02 DIAGNOSIS — S42452A Displaced fracture of lateral condyle of left humerus, initial encounter for closed fracture: Secondary | ICD-10-CM | POA: Insufficient documentation

## 2016-06-02 NOTE — Progress Notes (Signed)
Spoke to patient about CT scan.  Please make appointment for her to see me back on 4/9-4/10.  Thanks. She's aware

## 2016-06-02 NOTE — Progress Notes (Signed)
Office Visit Note   Patient: Julie Lee           Date of Birth: 26-Feb-1956           MRN: [id number] Visit Date: 06/02/2016              Requested by: Joretta Bachelor, PA [address] [place], [place] [address] PCP: Ivar Drape, PA   Assessment & Plan: Visit Diagnoses:  1. Closed fracture dislocation of left elbow joint, initial encounter     Plan: CT of left elbow order to assess the true extent of the injury. The post reduction x-rays do show mild subluxation of the elbow joint and radiocapitellar joint. Continue long-arm splint. I will follow up on the CT scan  Follow-Up Instructions: Return in about 10 days (around 06/12/2016).   Orders:  Orders Placed This Encounter  Procedures  . CT ELBOW LEFT WO CONTRAST   No orders of the defined types were placed in this encounter.     Procedures: No procedures performed   Clinical Data: No additional findings.   Subjective: Chief Complaint  Patient presents with  . Left Elbow - Pain    Patient is a 61 year old female who is left-hand dominant who had a mechanical fall 2 days ago and sustained a fracture dislocation of her left elbow. This was reduced in the ER and placed in a splint and given follow-up today. She has severe pain and swelling and bruising. She denies any numbness.    Review of Systems  Constitutional: Negative.   HENT: Negative.   Eyes: Negative.   Respiratory: Negative.   Cardiovascular: Negative.   Endocrine: Negative.   Musculoskeletal: Negative.   Neurological: Negative.   Hematological: Negative.   Psychiatric/Behavioral: Negative.   All other systems reviewed and are negative.    Objective: Vital Signs: There were no vitals taken for this visit.  Physical Exam  Constitutional: She is oriented to person, place, and time. She appears well-developed and well-nourished.  HENT:  Head: Normocephalic and atraumatic.  Eyes: EOM are normal.  Neck: Neck supple.  Pulmonary/Chest:  Effort normal.  Abdominal: Soft.  Neurological: She is alert and oriented to person, place, and time.  Skin: Skin is warm. Capillary refill takes less than 2 seconds.  Psychiatric: She has a normal mood and affect. Her behavior is normal. Judgment and thought content normal.  Nursing note and vitals reviewed.   Ortho Exam Left elbow exam shows severe swelling and bruising with significant pain with any movement. She is neurovascularly intact. Elbow exam is limited secondary to pain and guarding and severe swelling. Specialty Comments:  No specialty comments available.  Imaging: No results found.   PMFS History: Patient Active Problem List   Diagnosis Date Noted  . Closed fracture dislocation of left elbow joint, initial encounter 06/02/2016  . HTN (hypertension) 08/23/2012  . OA (osteoarthritis) 08/23/2012  . Ulcerative colitis, unspecified 08/23/2012   Past Medical History:  Diagnosis Date  . Allergy   . Anemia   . Anxiety   . Arthritis   . Colitis   . Hypertension   . Kidney stones   . UTI (lower urinary tract infection)     Family History  Problem Relation Age of Onset  . Heart disease Father   . Hyperlipidemia Mother   . Colon cancer Maternal Uncle 38  . Colitis Brother     had colectomy  . Diabetes Paternal Uncle   . Diabetes Paternal Grandfather   . Heart  disease Paternal Grandfather   . Kidney disease Paternal Uncle   . Heart disease Maternal Grandfather     Past Surgical History:  Procedure Laterality Date  . CESAREAN SECTION    . COLONOSCOPY    . TUBAL LIGATION     Social History   Occupational History  . Not on file.   Social History Main Topics  . Smoking status: Never Smoker  . Smokeless tobacco: Never Used  . Alcohol use Yes     Comment: rarely  . Drug use: No  . Sexual activity: No

## 2016-06-14 ENCOUNTER — Encounter (INDEPENDENT_AMBULATORY_CARE_PROVIDER_SITE_OTHER): Payer: Self-pay | Admitting: Orthopaedic Surgery

## 2016-06-14 ENCOUNTER — Ambulatory Visit (INDEPENDENT_AMBULATORY_CARE_PROVIDER_SITE_OTHER): Payer: Self-pay

## 2016-06-14 ENCOUNTER — Ambulatory Visit (INDEPENDENT_AMBULATORY_CARE_PROVIDER_SITE_OTHER): Payer: Self-pay | Admitting: Orthopaedic Surgery

## 2016-06-14 DIAGNOSIS — S42402A Unspecified fracture of lower end of left humerus, initial encounter for closed fracture: Secondary | ICD-10-CM

## 2016-06-14 MED ORDER — HYDROCODONE-ACETAMINOPHEN 5-325 MG PO TABS: 1.0000 | ORAL_TABLET | Freq: Four times a day (QID) | ORAL | 0 refills | Status: DC | PRN

## 2016-06-14 NOTE — Progress Notes (Signed)
Office Visit Note   Patient: Julie Lee           Date of Birth: 1955-11-19           MRN: [id number] Visit Date: 06/14/2016              Requested by: Joretta Bachelor, PA [place], [place] [address] PCP: Ivar Drape, PA   Assessment & Plan: Visit Diagnoses:  1. Closed fracture dislocation of left elbow joint, initial encounter     Plan: At this point begin very gentle range of motion with OT. Hinged elbow brace prescription given to [employer]. Norco prescription given. Follow-up in 4 weeks for recheck and 2 view x-rays of the left elbow  Follow-Up Instructions: Return in about 4 weeks (around 07/12/2016).   Orders:  Orders Placed This Encounter  Procedures  . XR Elbow 2 Views Left   Meds ordered this encounter  Medications  . HYDROcodone-acetaminophen (NORCO) 5-325 MG tablet    Sig: Take 1 tablet by mouth every 6 (six) hours as needed.    Dispense:  40 tablet    Refill:  0      Procedures: No procedures performed   Clinical Data: No additional findings.   Subjective: Chief Complaint  Patient presents with  . Left Elbow - Pain, Follow-up    Patient follows up today for her left elbow fracture dislocation. She is 2 weeks out. Pain is slightly better. Pain does radiate down into the forearm.    Review of Systems  Constitutional: Negative.   HENT: Negative.   Eyes: Negative.   Respiratory: Negative.   Cardiovascular: Negative.   Endocrine: Negative.   Musculoskeletal: Negative.   Neurological: Negative.   Hematological: Negative.   Psychiatric/Behavioral: Negative.   All other systems reviewed and are negative.    Objective: Vital Signs: There were no vitals taken for this visit.  Physical Exam  Constitutional: She is oriented to person, place, and time. She appears well-developed and well-nourished.  Pulmonary/Chest: Effort normal.  Neurological: She is alert and oriented to person, place, and time.  Skin: Skin is warm.  Capillary refill takes less than 2 seconds.  Psychiatric: She has a normal mood and affect. Her behavior is normal. Judgment and thought content normal.  Nursing note and vitals reviewed.   Ortho Exam Left elbow exam shows improved bruising and swelling.  She is neurovascularly intact. Her elbow range of motion is 45 to 95. Specialty Comments:  No specialty comments available.  Imaging: Xr Elbow 2 Views Left  Result Date: 06/14/2016 Stable elbow joint alignment with small comminuted coronoid fracture    PMFS History: Patient Active Problem List   Diagnosis Date Noted  . Closed fracture dislocation of left elbow joint, initial encounter 06/02/2016  . HTN (hypertension) 08/23/2012  . OA (osteoarthritis) 08/23/2012  . Ulcerative colitis, unspecified 08/23/2012   Past Medical History:  Diagnosis Date  . Allergy   . Anemia   . Anxiety   . Arthritis   . Colitis   . Hypertension   . Kidney stones   . UTI (lower urinary tract infection)     Family History  Problem Relation Age of Onset  . Heart disease Father   . Hyperlipidemia Mother   . Colon cancer Maternal Uncle 76  . Colitis Brother     had colectomy  . Diabetes Paternal Uncle   . Diabetes Paternal Grandfather   . Heart disease Paternal Grandfather   . Kidney disease Paternal Uncle   .  Heart disease Maternal Grandfather     Past Surgical History:  Procedure Laterality Date  . CESAREAN SECTION    . COLONOSCOPY    . TUBAL LIGATION     Social History   Occupational History  . Not on file.   Social History Main Topics  . Smoking status: Never Smoker  . Smokeless tobacco: Never Used  . Alcohol use Yes     Comment: rarely  . Drug use: No  . Sexual activity: No

## 2016-06-28 ENCOUNTER — Ambulatory Visit (INDEPENDENT_AMBULATORY_CARE_PROVIDER_SITE_OTHER): Payer: Self-pay

## 2016-06-28 ENCOUNTER — Ambulatory Visit (INDEPENDENT_AMBULATORY_CARE_PROVIDER_SITE_OTHER): Payer: Self-pay | Admitting: Orthopaedic Surgery

## 2016-06-28 DIAGNOSIS — M25522 Pain in left elbow: Secondary | ICD-10-CM

## 2016-06-28 DIAGNOSIS — S42402A Unspecified fracture of lower end of left humerus, initial encounter for closed fracture: Secondary | ICD-10-CM

## 2016-06-28 NOTE — Progress Notes (Signed)
Patient comes back today for follow-up of her elbow fracture dislocation. She has been wearing the hinged elbow brace. She has not started therapy due to lack of insurance and money. Her pain is overall doing better. X-ray show stable appearance of the elbow. No interval worsening. Range of motion is slightly improved. She still has swelling. At this point I have unlocked her hinged brace. We'll begin range of motion. I urged her to start OT as soon as possible. Follow up in 4 weeks. Repeat 2 view x-rays of the left elbow.

## 2016-07-07 ENCOUNTER — Telehealth (INDEPENDENT_AMBULATORY_CARE_PROVIDER_SITE_OTHER): Payer: Self-pay

## 2016-07-07 NOTE — Telephone Encounter (Signed)
Julie Lee with [facility] needs patient operative note faxed to [phone].  CB# is [phone].  Please Advise. Thank You.

## 2016-07-07 NOTE — Telephone Encounter (Signed)
Called Julie Lee back to advise her I did  Not see any Op note she never had SU. She states to send her the last OV Note.   Faxed.

## 2016-07-12 ENCOUNTER — Ambulatory Visit (INDEPENDENT_AMBULATORY_CARE_PROVIDER_SITE_OTHER): Payer: Self-pay | Admitting: Orthopaedic Surgery

## 2016-07-28 ENCOUNTER — Ambulatory Visit (INDEPENDENT_AMBULATORY_CARE_PROVIDER_SITE_OTHER): Payer: Self-pay | Admitting: Orthopaedic Surgery

## 2016-07-28 ENCOUNTER — Encounter (INDEPENDENT_AMBULATORY_CARE_PROVIDER_SITE_OTHER): Payer: Self-pay

## 2016-08-05 ENCOUNTER — Ambulatory Visit (INDEPENDENT_AMBULATORY_CARE_PROVIDER_SITE_OTHER): Payer: Self-pay

## 2016-08-05 ENCOUNTER — Ambulatory Visit (INDEPENDENT_AMBULATORY_CARE_PROVIDER_SITE_OTHER): Payer: Self-pay | Admitting: Orthopaedic Surgery

## 2016-08-05 DIAGNOSIS — S42402A Unspecified fracture of lower end of left humerus, initial encounter for closed fracture: Secondary | ICD-10-CM

## 2016-08-05 NOTE — Progress Notes (Signed)
Julie Lee is approximately 6 weeks status post left elbow fracture. She just started therapy recently due to financial reasons. She essentially has no pain. Her range of motion is progressing. She is able to range it from 25-110.  She does still have a little bit of persistent swelling in her elbow. Her x-rays show stable alignment and findings. I encouraged her to continue with therapy as this is significantly improving her function and range of motion. Unfortunately this is her dominant arm. Questions encouraged and answered. Follow-up in 6 weeks for recheck. No x-rays needed unless she is having issues.

## 2016-09-19 ENCOUNTER — Ambulatory Visit (INDEPENDENT_AMBULATORY_CARE_PROVIDER_SITE_OTHER): Payer: Self-pay | Admitting: Orthopaedic Surgery

## 2016-09-28 ENCOUNTER — Telehealth (INDEPENDENT_AMBULATORY_CARE_PROVIDER_SITE_OTHER): Payer: Self-pay | Admitting: Orthopaedic Surgery

## 2016-09-28 NOTE — Telephone Encounter (Signed)
Heather with [facility] called needing office notes and X-rays faxed to her. The fax# is [phone]   The fax# is [phone]

## 2016-09-29 NOTE — Telephone Encounter (Signed)
faxed

## 2016-09-30 NOTE — Telephone Encounter (Signed)
ERROR

## 2016-10-03 ENCOUNTER — Encounter: Payer: Self-pay | Admitting: Physician Assistant

## 2016-10-03 ENCOUNTER — Ambulatory Visit (INDEPENDENT_AMBULATORY_CARE_PROVIDER_SITE_OTHER): Payer: Self-pay | Admitting: Physician Assistant

## 2016-10-03 ENCOUNTER — Other Ambulatory Visit: Payer: Self-pay | Admitting: Physician Assistant

## 2016-10-03 VITALS — BP 122/74 | HR 71 | Temp 98.0°F | Resp 14 | Ht 64.0 in | Wt 180.0 lb

## 2016-10-03 DIAGNOSIS — R3129 Other microscopic hematuria: Secondary | ICD-10-CM

## 2016-10-03 DIAGNOSIS — M545 Low back pain: Secondary | ICD-10-CM

## 2016-10-03 DIAGNOSIS — F419 Anxiety disorder, unspecified: Secondary | ICD-10-CM

## 2016-10-03 DIAGNOSIS — I10 Essential (primary) hypertension: Secondary | ICD-10-CM

## 2016-10-03 DIAGNOSIS — R8290 Unspecified abnormal findings in urine: Secondary | ICD-10-CM

## 2016-10-03 DIAGNOSIS — R252 Cramp and spasm: Secondary | ICD-10-CM

## 2016-10-03 DIAGNOSIS — K5190 Ulcerative colitis, unspecified, without complications: Secondary | ICD-10-CM

## 2016-10-03 LAB — POCT URINALYSIS DIP (MANUAL ENTRY)
Glucose, UA: NEGATIVE mg/dL
Leukocytes, UA: NEGATIVE
Nitrite, UA: NEGATIVE
PH UA: 5.5 (ref 5.0–8.0)
Protein Ur, POC: 30 mg/dL — AB
UROBILINOGEN UA: 0.2 U/dL

## 2016-10-03 MED ORDER — ATENOLOL 50 MG PO TABS: 25.0000 mg | ORAL_TABLET | Freq: Every day | ORAL | 5 refills | Status: DC

## 2016-10-03 MED ORDER — TRAMADOL HCL 50 MG PO TABS: ORAL_TABLET | ORAL | 3 refills | Status: DC

## 2016-10-03 MED ORDER — LISINOPRIL 20 MG PO TABS: 20.0000 mg | ORAL_TABLET | Freq: Every day | ORAL | 5 refills | Status: DC

## 2016-10-03 MED ORDER — CYCLOBENZAPRINE HCL 10 MG PO TABS: 10.0000 mg | ORAL_TABLET | Freq: Two times a day (BID) | ORAL | 5 refills | Status: DC | PRN

## 2016-10-03 MED ORDER — DIAZEPAM 10 MG PO TABS: 10.0000 mg | ORAL_TABLET | Freq: Two times a day (BID) | ORAL | 0 refills | Status: DC | PRN

## 2016-10-03 MED ORDER — BALSALAZIDE DISODIUM 750 MG PO CAPS: 2250.0000 mg | ORAL_CAPSULE | Freq: Three times a day (TID) | ORAL | 3 refills | Status: DC

## 2016-10-03 NOTE — Progress Notes (Signed)
PRIMARY CARE AT [facility] [address] [place] [address] [phone]  Date:  10/03/2016   Name:  Julie Lee   DOB:  [birth year]   MRN:  [id number]  PCP:  Joretta Bachelor, PA    History of Present Illness:  Julie Lee is a 61 y.o. female patient who presents to PCP with  Chief Complaint  Patient presents with  . Medication Refill    All  . Depression    See screening     dshe has not been able to work in 20 weeks.  Anxiety: she has high stressors.  he is looking after her granddaugher who is 3 years old.  She is also taking care of her mother.    she noted that she became a "crazy person".  She was paranoid, irritated, and impulsive.  She quit taking the zoloft at this.    She takes 1 three times per day instead of the three times per day.  She has had no blood in her stool.    Dysuria with a strong odor, and some bladder leakage.  She wears a poise pad to help this.   Patient Active Problem List   Diagnosis Date Noted  . Closed fracture dislocation of left elbow joint, initial encounter 06/02/2016  . HTN (hypertension) 08/23/2012  . OA (osteoarthritis) 08/23/2012  . Ulcerative colitis, unspecified 08/23/2012    Past Medical History:  Diagnosis Date  . Allergy   . Anemia   . Anxiety   . Arthritis   . Colitis   . Hypertension   . Kidney stones   . UTI (lower urinary tract infection)     Past Surgical History:  Procedure Laterality Date  . CESAREAN SECTION    . COLONOSCOPY    . TUBAL LIGATION      Social History  Substance Use Topics  . Smoking status: Never Smoker  . Smokeless tobacco: Never Used  . Alcohol use Yes     Comment: rarely    Family History  Problem Relation Age of Onset  . Heart disease Father   . Hyperlipidemia Mother   . Colon cancer Maternal Uncle 50  . Colitis Brother        had colectomy  . Diabetes Paternal Uncle   . Diabetes Paternal Grandfather   . Heart disease Paternal Grandfather   . Kidney disease Paternal  Uncle   . Heart disease Maternal Grandfather     No Known Allergies  Medication list has been reviewed and updated.  Current Outpatient Prescriptions on File Prior to Visit  Medication Sig Dispense Refill  . atenolol (TENORMIN) 50 MG tablet Take 0.5-1 tablets (25-50 mg total) by mouth daily. (Patient taking differently: Take 25 mg by mouth at bedtime. ) 30 tablet 5  . balsalazide (COLAZAL) 750 MG capsule Take 3 capsules (2,250 mg total) by mouth 3 (three) times daily. (Patient taking differently: Take 2,250 mg by mouth 3 (three) times daily as needed (flare up). ) 270 capsule 3  . cyclobenzaprine (FLEXERIL) 10 MG tablet Take 1 tablet (10 mg total) by mouth 2 (two) times daily as needed for muscle spasms. 20 tablet 0  . diazepam (VALIUM) 10 MG tablet Take 1 tablet (10 mg total) by mouth every 12 (twelve) hours as needed for anxiety. 30 tablet 0  . gabapentin (NEURONTIN) 300 MG capsule Take 1 capsule (300 mg total) by mouth 3 (three) times daily. (Patient taking differently: Take 300 mg by mouth 3 (three) times daily  as needed (pain). ) 90 capsule 5  . lisinopril (PRINIVIL,ZESTRIL) 20 MG tablet Take 1 tablet (20 mg total) by mouth daily. (Patient taking differently: Take 20 mg by mouth at bedtime. ) 30 tablet 5  . Multiple Vitamins-Minerals (MULTIVITAMIN GUMMIES ADULT) CHEW Chew 1 tablet by mouth daily.    . traMADol (ULTRAM) 50 MG tablet TAKE ONE TO TWO TABLETS BY MOUTH TWICE DAILY (Patient taking differently: Take 50-100 mg by mouth 2 (two) times daily. 100 mg every morning and 50 mg every afternoon) 120 tablet 0  . HYDROcodone-acetaminophen (NORCO) 5-325 MG tablet Take 1 tablet by mouth every 6 (six) hours as needed. (Patient not taking: Reported on 08/05/2016) 40 tablet 0  . oxyCODONE-acetaminophen (PERCOCET) 5-325 MG tablet Take 2 tablets by mouth every 4 (four) hours as needed. (Patient not taking: Reported on 08/05/2016) 20 tablet 0   No current facility-administered medications on file prior  to visit.     ROS ROS otherwise unremarkable unless listed above.  Physical Examination: BP 122/74   Pulse 71   Temp 98 F (36.7 C) (Oral)   Resp 14   Ht 5' 4"  (1.626 m)   Wt 180 lb (81.6 kg)   SpO2 94%   BMI 30.90 kg/m  Ideal Body Weight: Weight in (lb) to have BMI = 25: 145.3  Physical Exam  Constitutional: She is oriented to person, place, and time. She appears well-developed and well-nourished. No distress.  HENT:  Head: Normocephalic and atraumatic.  Right Ear: External ear normal.  Left Ear: External ear normal.  Eyes: Pupils are equal, round, and reactive to light. Conjunctivae and EOM are normal.  Cardiovascular: Normal rate.   Pulmonary/Chest: Effort normal. No respiratory distress.  Neurological: She is alert and oriented to person, place, and time.  Skin: She is not diaphoretic.  Psychiatric: She has a normal mood and affect. Her behavior is normal.    Results for orders placed or performed in visit on 10/03/16  POCT urinalysis dipstick  Result Value Ref Range   Color, UA straw (A) yellow   Clarity, UA clear clear   Glucose, UA negative negative mg/dL   Bilirubin, UA small (A) negative   Ketones, POC UA trace (5) (A) negative mg/dL   Spec Grav, UA >=1.030 (A) 1.010 - 1.025   Blood, UA moderate (A) negative   pH, UA 5.5 5.0 - 8.0   Protein Ur, POC =30 (A) negative mg/dL   Urobilinogen, UA 0.2 0.2 or 1.0 E.U./dL   Nitrite, UA Negative Negative   Leukocytes, UA Negative Negative    Assessment and Plan: Julie Lee is a 61 y.o. female who is here today for cc of medication refill.  Advised hydration Refilling anxiety medication and pain for 6 months.  Return at that time.  colazal refilled for 6 months. Ulcerative colitis without complications, unspecified location ([place]) - Plan: balsalazide (COLAZAL) 750 MG capsule, CANCELED: POCT urinalysis dipstick  Essential hypertension - Plan: lisinopril (PRINIVIL,ZESTRIL) 20 MG tablet, atenolol (TENORMIN) 50  MG tablet, POCT urinalysis dipstick  Cramp of both lower extremities - Plan: cyclobenzaprine (FLEXERIL) 10 MG tablet  Bilateral low back pain, unspecified chronicity, with sciatica presence unspecified - Plan: cyclobenzaprine (FLEXERIL) 10 MG tablet, traMADol (ULTRAM) 50 MG tablet  Anxiety - Plan: diazepam (VALIUM) 10 MG tablet  Malodorous urine - Plan: CANCELED: POCT urinalysis dipstick  Ivar Drape, PA-C [facility] 8/16/20188:12 AM

## 2016-10-03 NOTE — Patient Instructions (Addendum)
  Please start hydrating well with 64 oz of water if not more.  This is almost 4 regular sized water bottles. I will refill your medication for 6 months.   Please return at that time.  I can refill the anxiety medicine and pain until the 6 months as well.    IF you received an x-ray today, you will receive an invoice from [facility]. Please contact [facility] at [phone] with questions or concerns regarding your invoice.   IF you received labwork today, you will receive an invoice from [place]. Please contact [facility] at [phone] with questions or concerns regarding your invoice.   Our billing staff will not be able to assist you with questions regarding bills from these companies.  You will be contacted with the lab results as soon as they are available. The fastest way to get your results is to activate your My Chart account. Instructions are located on the last page of this paperwork. If you have not heard from [place] regarding the results in 2 weeks, please contact this office.

## 2016-12-30 ENCOUNTER — Other Ambulatory Visit: Payer: Self-pay | Admitting: Physician Assistant

## 2016-12-30 DIAGNOSIS — F419 Anxiety disorder, unspecified: Secondary | ICD-10-CM

## 2016-12-31 NOTE — Telephone Encounter (Signed)
Please advise 

## 2017-01-22 ENCOUNTER — Other Ambulatory Visit: Payer: Self-pay | Admitting: Family Medicine

## 2017-01-22 DIAGNOSIS — M545 Low back pain: Secondary | ICD-10-CM

## 2017-04-13 ENCOUNTER — Telehealth: Payer: Self-pay | Admitting: Physician Assistant

## 2017-04-13 MED ORDER — TRAMADOL HCL 50 MG PO TABS: 50.0000 mg | ORAL_TABLET | Freq: Three times a day (TID) | ORAL | 0 refills | Status: DC | PRN

## 2017-04-13 NOTE — Telephone Encounter (Signed)
Copied from [place]. Topic: Quick Communication - See Telephone Encounter >> Apr 13, 2017  9:08 AM Bea Graff, NT wrote: CRM for notification. See Telephone encounter for: Pt wanting to see if she can get a partial refill on Tramadol until her appt on 04/26/17. Uses [facility] on [address]  04/13/17.

## 2017-04-13 NOTE — Telephone Encounter (Signed)
Filled until... Alert patient

## 2017-04-13 NOTE — Telephone Encounter (Signed)
Pt  Requesting a  Refill  Of  Ultram  LOV 10-03-2016  Last fill 10-03-2016   Pharmacy   [facility]  [address]

## 2017-04-13 NOTE — Telephone Encounter (Signed)
Pt requesting enough Tramadol until her appt on 04/26/17 Please Advise

## 2017-04-14 ENCOUNTER — Ambulatory Visit (INDEPENDENT_AMBULATORY_CARE_PROVIDER_SITE_OTHER): Payer: Self-pay

## 2017-04-14 ENCOUNTER — Ambulatory Visit (INDEPENDENT_AMBULATORY_CARE_PROVIDER_SITE_OTHER): Payer: BLUE CROSS/BLUE SHIELD

## 2017-04-14 ENCOUNTER — Encounter (INDEPENDENT_AMBULATORY_CARE_PROVIDER_SITE_OTHER): Payer: Self-pay | Admitting: Orthopaedic Surgery

## 2017-04-14 ENCOUNTER — Ambulatory Visit (INDEPENDENT_AMBULATORY_CARE_PROVIDER_SITE_OTHER): Payer: BLUE CROSS/BLUE SHIELD | Admitting: Orthopaedic Surgery

## 2017-04-14 DIAGNOSIS — M25522 Pain in left elbow: Secondary | ICD-10-CM | POA: Diagnosis not present

## 2017-04-14 DIAGNOSIS — S42402A Unspecified fracture of lower end of left humerus, initial encounter for closed fracture: Secondary | ICD-10-CM | POA: Diagnosis not present

## 2017-04-14 NOTE — Progress Notes (Signed)
Office Visit Note   Patient: Julie Lee           Date of Birth: September 26, 1955           MRN: [id number] Visit Date: 04/14/2017              Requested by: Joretta Bachelor, PA [place], [place] [id number] PCP: Joretta Bachelor, PA   Assessment & Plan: Visit Diagnoses:  1. Pain in left elbow   2. Closed fracture dislocation of left elbow joint, initial encounter     Plan: Impression is 62 year old female with healed left elbow fracture dislocation with resultant and expected stiffness.  I discussed with her that the cubitus varus is due to rotation through her shoulder and radiographically and clinically she has done actually have any malalignments.  She does have limitation with her elbow extension and flexion which is what we had discussed initially when we started treating her injury.  I did offer an elbow brace to help with her extension and flexion but she declined this option.  We also briefly discussed anterior capsular release which she is also not interested in.  Overall it sounds like she is able to perform most of her ADLs.  Questions encouraged and answered.  Follow-up as needed.  Follow-Up Instructions: Return if symptoms worsen or fail to improve.   Orders:  Orders Placed This Encounter  Procedures  . XR Elbow Complete Left (3+View)  . XR Elbow 2 Views Left   No orders of the defined types were placed in this encounter.     Procedures: No procedures performed   Clinical Data: No additional findings.   Subjective: Chief Complaint  Patient presents with  . Left Elbow - Follow-up    Julie Lee is a very pleasant 62 year old female who I took care of about 10 months ago for an elbow fracture dislocation.  She follows up today for discussion of stiffness in her elbow.  She is also concerned about the cosmetic appearance.  She is left-hand dominant and has difficulty with certain activities of daily living.    Review of Systems  Constitutional:  Negative.   HENT: Negative.   Eyes: Negative.   Respiratory: Negative.   Cardiovascular: Negative.   Endocrine: Negative.   Musculoskeletal: Negative.   Neurological: Negative.   Hematological: Negative.   Psychiatric/Behavioral: Negative.   All other systems reviewed and are negative.    Objective: Vital Signs: There were no vitals taken for this visit.  Physical Exam  Constitutional: She is oriented to person, place, and time. She appears well-developed and well-nourished.  Pulmonary/Chest: Effort normal.  Neurological: She is alert and oriented to person, place, and time.  Skin: Skin is warm. Capillary refill takes less than 2 seconds.  Psychiatric: She has a normal mood and affect. Her behavior is normal. Judgment and thought content normal.  Nursing note and vitals reviewed.   Ortho Exam Left elbow exam shows range of motion of 22-120 degrees with full pronation and supination.  She does not have a true cubitus varus.   Specialty Comments:  No specialty comments available.  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  Posttraumatic arthritis.    PMFS History: Patient Active Problem List   Diagnosis Date Noted  . Closed fracture dislocation of left elbow joint, initial encounter 06/02/2016  . HTN (hypertension)  08/23/2012  . OA (osteoarthritis) 08/23/2012  . Ulcerative colitis, unspecified 08/23/2012   Past Medical History:  Diagnosis Date  . Allergy   . Anemia   . Anxiety   . Arthritis   . Colitis   . Hypertension   . Kidney stones   . UTI (lower urinary tract infection)     Family History  Problem Relation Age of Onset  . Heart disease Father   . Hyperlipidemia Mother   . Colon cancer Maternal Uncle 20  . Colitis Brother        had colectomy  . Diabetes Paternal Uncle   . Diabetes  Paternal Grandfather   . Heart disease Paternal Grandfather   . Kidney disease Paternal Uncle   . Heart disease Maternal Grandfather     Past Surgical History:  Procedure Laterality Date  . CESAREAN SECTION    . COLONOSCOPY    . TUBAL LIGATION     Social History   Occupational History  . Not on file  Tobacco Use  . Smoking status: Never Smoker  . Smokeless tobacco: Never Used  Substance and Sexual Activity  . Alcohol use: Yes    Comment: rarely  . Drug use: No  . Sexual activity: No    Birth control/protection: Abstinence

## 2017-04-26 ENCOUNTER — Ambulatory Visit: Payer: BLUE CROSS/BLUE SHIELD | Admitting: Physician Assistant

## 2017-04-26 ENCOUNTER — Encounter: Payer: Self-pay | Admitting: Physician Assistant

## 2017-04-26 ENCOUNTER — Other Ambulatory Visit: Payer: Self-pay

## 2017-04-26 VITALS — BP 124/72 | HR 57 | Temp 97.5°F | Resp 16 | Ht 64.0 in | Wt 185.0 lb

## 2017-04-26 DIAGNOSIS — R002 Palpitations: Secondary | ICD-10-CM | POA: Diagnosis not present

## 2017-04-26 DIAGNOSIS — F419 Anxiety disorder, unspecified: Secondary | ICD-10-CM | POA: Diagnosis not present

## 2017-04-26 DIAGNOSIS — I10 Essential (primary) hypertension: Secondary | ICD-10-CM

## 2017-04-26 DIAGNOSIS — M1990 Unspecified osteoarthritis, unspecified site: Secondary | ICD-10-CM

## 2017-04-26 DIAGNOSIS — Z23 Encounter for immunization: Secondary | ICD-10-CM | POA: Diagnosis not present

## 2017-04-26 MED ORDER — DIAZEPAM 10 MG PO TABS: 10.0000 mg | ORAL_TABLET | Freq: Two times a day (BID) | ORAL | 0 refills | Status: DC | PRN

## 2017-04-26 MED ORDER — LISINOPRIL 20 MG PO TABS
20.0000 mg | ORAL_TABLET | Freq: Every day | ORAL | 5 refills | Status: DC
Start: 2017-04-26 — End: 2017-08-11

## 2017-04-26 MED ORDER — ATENOLOL 50 MG PO TABS: 50.0000 mg | ORAL_TABLET | Freq: Every day | ORAL | 1 refills | Status: DC

## 2017-04-26 MED ORDER — TRAMADOL HCL 50 MG PO TABS: 50.0000 mg | ORAL_TABLET | Freq: Three times a day (TID) | ORAL | 2 refills | Status: AC | PRN

## 2017-04-26 MED ORDER — ZOSTER VAC RECOMB ADJUVANTED 50 MCG/0.5ML IM SUSR: 0.5000 mL | Freq: Once | INTRAMUSCULAR | 1 refills | Status: AC

## 2017-04-26 NOTE — Patient Instructions (Addendum)
  I am referring you to a cardiologist at this time.  Please await contact.  They likely will set you up with a holter monitor or another device to see if wee can see the reason for the fluttering you mentioned.  Continue your blood pressure medications.      IF you received an x-ray today, you will receive an invoice from [facility]. Please contact [facility] at [phone] with questions or concerns regarding your invoice.   IF you received labwork today, you will receive an invoice from [place]. Please contact [facility] at [phone] with questions or concerns regarding your invoice.   Our billing staff will not be able to assist you with questions regarding bills from these companies.  You will be contacted with the lab results as soon as they are available. The fastest way to get your results is to activate your My Chart account. Instructions are located on the last page of this paperwork. If you have not heard from [place] regarding the results in 2 weeks, please contact this office.

## 2017-04-26 NOTE — Progress Notes (Signed)
PRIMARY CARE AT [facility] [address], [place] [address] [phone]  Date:  04/26/2017   Name:  Julie Lee   DOB:  1955/11/27   MRN:  [id number]  PCP:  Joretta Bachelor, PA    History of Present Illness:  MARCELA ALATORRE is a 62 y.o. female patient who presents to PCP with  Chief Complaint  Patient presents with  . Medication Refill    Atenolol (Tenormin) 50 mg, Lisinopril 20 mg, Diazepam  (Valium) 10 mg, Tramadol (Ultram) 50 mg    Bp: patient takes her blood pressure medication as prescribed  Anxiety: variant anxiety with some panic attacks which are minimal.  She is the caretaker of her mother.  Her mother can be aggressive and difficult.    OA: Patient is doing well with the tramadol to help assist with back pain.  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  She has newly closed fracture of the left elbow joint.  This is followed by Dr. Erlinda Hong.  She will take the tramadol up to 3 times per day.    Patient also reports palpitations that can occur with exertion.  This has been happening for several years.  She has not increased caffeine intake.  No cp or nausea associated.  She has held off on this evaluation as she had previously not had insurance.    Patient Active Problem List   Diagnosis Date Noted  . Closed fracture dislocation of left elbow joint, initial encounter 06/02/2016  . HTN (hypertension) 08/23/2012  . OA (osteoarthritis) 08/23/2012  . Ulcerative colitis, unspecified 08/23/2012    Past Medical History:  Diagnosis Date  . Allergy   . Anemia   . Anxiety   . Arthritis   . Colitis   . Hypertension   . Kidney stones   . UTI (lower urinary tract infection)     Past Surgical History:  Procedure Laterality Date  . CESAREAN SECTION    . COLONOSCOPY    . TUBAL LIGATION      Social History   Tobacco Use  . Smoking status: Never Smoker  . Smokeless tobacco: Never Used  Substance Use Topics  . Alcohol use: Yes    Comment: rarely   . Drug use: No    Family History  Problem Relation Age of Onset  . Heart disease Father   . Hyperlipidemia Mother   . Colon cancer Maternal Uncle 63  . Colitis Brother        had colectomy  . Diabetes Paternal Uncle   . Diabetes Paternal Grandfather   . Heart disease Paternal Grandfather   . Kidney disease Paternal Uncle   . Heart disease Maternal Grandfather     No Known Allergies  Medication list has been reviewed and updated.  Current Outpatient Medications on File Prior to Visit  Medication Sig Dispense Refill  . atenolol (TENORMIN) 50 MG tablet Take 0.5-1 tablets (25-50 mg total) by mouth daily. 30 tablet 5  . balsalazide (COLAZAL) 750 MG capsule Take 3 capsules (2,250 mg total) by mouth 3 (three) times daily. 270 capsule 3  . cyclobenzaprine (FLEXERIL) 10 MG tablet Take 1 tablet (10 mg total) by mouth 2 (two) times daily as needed for muscle spasms. 180 tablet 5  . diazepam (VALIUM) 10 MG tablet TAKE 1 TABLET BY MOUTH EVERY 12 HOURS AS NEEDED FOR ANXIETY 60 tablet 0  . gabapentin (NEURONTIN) 300 MG capsule TAKE 1 CAPSULE BY MOUTH THREE TIMES DAILY  90 capsule 5  . lisinopril (PRINIVIL,ZESTRIL) 20 MG tablet Take 1 tablet (20 mg total) by mouth daily. 30 tablet 5  . Multiple Vitamins-Minerals (MULTIVITAMIN GUMMIES ADULT) CHEW Chew 1 tablet by mouth daily.    . traMADol (ULTRAM) 50 MG tablet Take 1 tablet (50 mg total) by mouth every 8 (eight) hours as needed for up to 13 days. 39 tablet 0  . HYDROcodone-acetaminophen (NORCO) 5-325 MG tablet Take 1 tablet by mouth every 6 (six) hours as needed. (Patient not taking: Reported on 08/05/2016) 40 tablet 0  . oxyCODONE-acetaminophen (PERCOCET) 5-325 MG tablet Take 2 tablets by mouth every 4 (four) hours as needed. (Patient not taking: Reported on 08/05/2016) 20 tablet 0   No current facility-administered medications on file prior to visit.     ROS ROS otherwise unremarkable unless listed above.  Physical Examination: BP 124/72    Pulse (!) 57   Temp (!) 97.5 F (36.4 C) (Oral)   Resp 16   Ht 5' 4"  (1.626 m)   Wt 185 lb (83.9 kg)   SpO2 96%   BMI 31.76 kg/m  Ideal Body Weight: Weight in (lb) to have BMI = 25: 145.3  Physical Exam  Constitutional: She is oriented to person, place, and time. She appears well-developed and well-nourished. No distress.  HENT:  Head: Normocephalic and atraumatic.  Right Ear: External ear normal.  Left Ear: External ear normal.  Eyes: Conjunctivae and EOM are normal. Pupils are equal, round, and reactive to light.  Cardiovascular: Normal rate and regular rhythm. Exam reveals decreased pulses.  Pulses:      Radial pulses are 2+ on the right side, and 2+ on the left side.  Pulmonary/Chest: Effort normal. No respiratory distress.  Neurological: She is alert and oriented to person, place, and time.  Skin: She is not diaphoretic.  Psychiatric: She has a normal mood and affect. Her behavior is normal.     Assessment and Plan: MADYSEN FAIRCLOTH is a 62 y.o. female who is here today for cc of  Chief Complaint  Patient presents with  . Medication Refill    Atenolol (Tenormin) 50 mg, Lisinopril 20 mg, Diazepam  (Valium) 10 mg, Tramadol (Ultram) 50 mg  --2014 reported dizziness.  Advised to cut the atenolol in half.  With the palpitations, will maintain at the 29m and refer to cardiology.  This should be formally evaluated with possible heart monitor.   Refilling bp medicine Valium filled--rtc additional refill 3 months Tramadol return in 3 months as well  Essential hypertension - Plan: CMP14+EGFR, EKG 12-Lead, TSH, lisinopril (PRINIVIL,ZESTRIL) 20 MG tablet, atenolol (TENORMIN) 50 MG tablet, Ambulatory referral to Cardiology  Palpitations - Plan: TSH, Ambulatory referral to Cardiology  Anxiety - Plan: diazepam (VALIUM) 10 MG tablet, traMADol (ULTRAM) 50 MG tablet  Osteoarthritis, unspecified osteoarthritis type, unspecified site - Plan: lisinopril (PRINIVIL,ZESTRIL) 20 MG tablet,  diazepam (VALIUM) 10 MG tablet, traMADol (ULTRAM) 50 MG tablet  Need for prophylactic vaccination and inoculation against influenza - Plan: Flu Vaccine QUAD 36+ mos IM  Need for shingles vaccine - Plan: Zoster Vaccine Adjuvanted (SHINGRIX) injection  SIvar Drape PA-C [facility] 3/1/20199:54 AM

## 2017-04-27 LAB — CMP14+EGFR
ALBUMIN: 4.6 g/dL (ref 3.6–4.8)
ALK PHOS: 77 IU/L (ref 39–117)
ALT: 22 IU/L (ref 0–32)
AST: 21 IU/L (ref 0–40)
Albumin/Globulin Ratio: 2.1 (ref 1.2–2.2)
BILIRUBIN TOTAL: 0.3 mg/dL (ref 0.0–1.2)
BUN / CREAT RATIO: 15 (ref 12–28)
BUN: 14 mg/dL (ref 8–27)
CHLORIDE: 105 mmol/L (ref 96–106)
CO2: 20 mmol/L (ref 20–29)
CREATININE: 0.92 mg/dL (ref 0.57–1.00)
Calcium: 10.4 mg/dL — ABNORMAL HIGH (ref 8.7–10.3)
GFR calc Af Amer: 76 mL/min/{1.73_m2} (ref >59)
GFR calc non Af Amer: 66 mL/min/{1.73_m2} (ref >59)
GLOBULIN, TOTAL: 2.2 g/dL (ref 1.5–4.5)
Glucose: 94 mg/dL (ref 65–99)
Potassium: 4.2 mmol/L (ref 3.5–5.2)
SODIUM: 143 mmol/L (ref 134–144)
Total Protein: 6.8 g/dL (ref 6.0–8.5)

## 2017-04-27 LAB — TSH: TSH: 1.77 u[IU]/mL (ref 0.450–4.500)

## 2017-05-05 ENCOUNTER — Encounter: Payer: Self-pay | Admitting: Physician Assistant

## 2017-05-12 ENCOUNTER — Telehealth: Payer: Self-pay | Admitting: Physician Assistant

## 2017-05-12 NOTE — Telephone Encounter (Signed)
Pt has cardiology appt scheduled on 06/15/17 with [facility] on [facility]. Referral was set as urgent. Is this appt okay or do we need to try to get pt in sooner? Thanks!

## 2017-05-13 NOTE — Telephone Encounter (Signed)
We can await this consult. Thank you!

## 2017-06-13 ENCOUNTER — Encounter: Payer: Self-pay | Admitting: Physician Assistant

## 2017-06-14 ENCOUNTER — Encounter: Payer: Self-pay | Admitting: Physician Assistant

## 2017-06-15 ENCOUNTER — Ambulatory Visit: Payer: Self-pay | Admitting: Physician Assistant

## 2017-06-29 ENCOUNTER — Other Ambulatory Visit: Payer: Self-pay | Admitting: Physician Assistant

## 2017-06-29 DIAGNOSIS — F419 Anxiety disorder, unspecified: Secondary | ICD-10-CM

## 2017-06-29 DIAGNOSIS — M1990 Unspecified osteoarthritis, unspecified site: Secondary | ICD-10-CM

## 2017-06-29 NOTE — Telephone Encounter (Signed)
Filled.  Advise return for further refills.

## 2017-08-01 ENCOUNTER — Encounter: Payer: Self-pay | Admitting: Family Medicine

## 2017-08-09 ENCOUNTER — Telehealth: Payer: Self-pay | Admitting: Physician Assistant

## 2017-08-09 NOTE — Telephone Encounter (Signed)
Copied from [place] [id number]. Topic: Quick Communication - Rx Refill/Question >> Aug 09, 2017 10:40 AM Antonieta Iba C wrote: Medication: traMADol (ULTRAM) 50 MG tablet   Has the patient contacted their pharmacy?  Yes  (Agent: If no, request that the patient contact the pharmacy for the refill.) (Agent: If yes, when and what did the pharmacy advise?)  Preferred Pharmacy (with phone number or street name): [facility], [place] [phone] (Phone) [phone] (Fax)      Agent: Please be advised that RX refills may take up to 3 business days. We ask that you follow-up with your pharmacy.

## 2017-08-10 NOTE — Telephone Encounter (Signed)
Ultram refill request.  Not on medication list.  LOV 04/26/17 with Ivar Drape

## 2017-08-10 NOTE — Telephone Encounter (Signed)
Sent [employer]. Pt needs OV.

## 2017-08-11 ENCOUNTER — Ambulatory Visit: Payer: BLUE CROSS/BLUE SHIELD | Admitting: Physician Assistant

## 2017-08-11 ENCOUNTER — Encounter: Payer: Self-pay | Admitting: Physician Assistant

## 2017-08-11 ENCOUNTER — Other Ambulatory Visit: Payer: Self-pay

## 2017-08-11 VITALS — BP 114/68 | HR 65 | Temp 98.6°F | Resp 18 | Ht 64.0 in | Wt 179.2 lb

## 2017-08-11 DIAGNOSIS — I10 Essential (primary) hypertension: Secondary | ICD-10-CM | POA: Diagnosis not present

## 2017-08-11 DIAGNOSIS — F419 Anxiety disorder, unspecified: Secondary | ICD-10-CM | POA: Diagnosis not present

## 2017-08-11 DIAGNOSIS — F411 Generalized anxiety disorder: Secondary | ICD-10-CM | POA: Insufficient documentation

## 2017-08-11 DIAGNOSIS — M1990 Unspecified osteoarthritis, unspecified site: Secondary | ICD-10-CM | POA: Diagnosis not present

## 2017-08-11 DIAGNOSIS — M8589 Other specified disorders of bone density and structure, multiple sites: Secondary | ICD-10-CM | POA: Diagnosis not present

## 2017-08-11 MED ORDER — TRAMADOL HCL 50 MG PO TABS: 50.0000 mg | ORAL_TABLET | Freq: Two times a day (BID) | ORAL | 0 refills | Status: DC | PRN

## 2017-08-11 MED ORDER — LISINOPRIL 20 MG PO TABS: 20.0000 mg | ORAL_TABLET | Freq: Every day | ORAL | 1 refills | Status: DC

## 2017-08-11 MED ORDER — DIAZEPAM 10 MG PO TABS: 10.0000 mg | ORAL_TABLET | Freq: Two times a day (BID) | ORAL | 0 refills | Status: DC | PRN

## 2017-08-11 MED ORDER — ATENOLOL 50 MG PO TABS: 50.0000 mg | ORAL_TABLET | Freq: Every day | ORAL | 1 refills | Status: DC

## 2017-08-11 MED ORDER — TRAMADOL HCL 50 MG PO TABS
50.0000 mg | ORAL_TABLET | Freq: Two times a day (BID) | ORAL | 0 refills | Status: DC | PRN
Start: 2017-08-11 — End: 2017-08-11

## 2017-08-11 NOTE — Progress Notes (Signed)
Julie Lee  MRN: [id number] DOB: Jan 19, 1956  PCP: Mancel Bale, PA-C  Chief Complaint  Patient presents with  . Medication Refill    all meds     Subjective:  Pt presents to clinic for medication refills.  She has been doing good.  OA - uses Ultram because she cannot use NSAIDs due to UC - she also has found that the gabapentin has helped with the aching pain - upon review of her chart it looks like the gabapentin was started due to back pain but she states that it does help with OA pain.  Anxiety state -patient uses Valium as needed for stressful events that causing her anxiety.  She shares care of her mother with her sister.  When her mother is living with her for the extended period of time she has a lot of increased stress due to the caregiving status.  She takes Valium for that.  She states she does not take it every day except during times of increased stress.  It looks like she uses about 60 every 2 to 3 months.   UC - sees Dr Ardis Hughs -   HTN - well controlled - on medication and not having problems  History is obtained by patient.  Review of Systems  Patient Active Problem List   Diagnosis Date Noted  . Anxiety state 08/11/2017  . Closed fracture dislocation of left elbow joint, initial encounter 06/02/2016  . HTN (hypertension) 08/23/2012  . OA (osteoarthritis) 08/23/2012  . Ulcerative colitis, unspecified 08/23/2012    Current Outpatient Medications on File Prior to Visit  Medication Sig Dispense Refill  . balsalazide (COLAZAL) 750 MG capsule Take 3 capsules (2,250 mg total) by mouth 3 (three) times daily. 270 capsule 3  . cyclobenzaprine (FLEXERIL) 10 MG tablet Take 1 tablet (10 mg total) by mouth 2 (two) times daily as needed for muscle spasms. 180 tablet 5  . gabapentin (NEURONTIN) 300 MG capsule TAKE 1 CAPSULE BY MOUTH THREE TIMES DAILY 90 capsule 5  . Multiple Vitamins-Minerals (MULTIVITAMIN GUMMIES ADULT) CHEW Chew 1 tablet by mouth daily.     No  current facility-administered medications on file prior to visit.     No Known Allergies  Past Medical History:  Diagnosis Date  . Allergy   . Anemia   . Anxiety   . Arthritis   . Colitis   . Hypertension   . Kidney stones   . UTI (lower urinary tract infection)    Social History   Social History Narrative   Lives alone   Good relationship with son and granddaughter   Social History   Tobacco Use  . Smoking status: Never Smoker  . Smokeless tobacco: Never Used  Substance Use Topics  . Alcohol use: Yes    Comment: rarely  . Drug use: No   family history includes Colitis in her brother; Colon cancer (age of onset: 43) in her maternal uncle; Diabetes in her paternal grandfather and paternal uncle; Drug abuse in her brother; Heart disease in her father, maternal grandfather, and paternal grandfather; Hyperlipidemia in her mother; Kidney disease in her paternal uncle; Lung cancer in her father.     Objective:  BP 114/68   Pulse 65   Temp 98.6 F (37 C) (Oral)   Resp 18   Ht 5' 4"  (1.626 m)   Wt 179 lb 3.2 oz (81.3 kg)   SpO2 93%   BMI 30.76 kg/m  Body mass index is 30.76 kg/m.  Wt Readings from Last 3 Encounters:  08/11/17 179 lb 3.2 oz (81.3 kg)  04/26/17 185 lb (83.9 kg)  10/03/16 180 lb (81.6 kg)    Physical Exam  Constitutional: She is oriented to person, place, and time. She appears well-developed and well-nourished.  HENT:  Head: Normocephalic and atraumatic.  Right Ear: Hearing and external ear normal.  Left Ear: Hearing and external ear normal.  Eyes: Conjunctivae are normal.  Neck: Normal range of motion.  Cardiovascular: Normal rate, regular rhythm and normal heart sounds.  No murmur heard. Pulmonary/Chest: Effort normal and breath sounds normal. She has no wheezes.  Neurological: She is alert and oriented to person, place, and time.  Skin: Skin is warm and dry.  Psychiatric: Judgment normal.  Vitals reviewed.   Assessment and Plan :    Essential hypertension - Plan: atenolol (TENORMIN) 50 MG tablet, lisinopril (PRINIVIL,ZESTRIL) 20 MG tablet  Osteoarthritis, unspecified osteoarthritis type, unspecified site - Plan: diazepam (VALIUM) 10 MG tablet, traMADol (ULTRAM) 50 MG tablet, DISCONTINUED: traMADol (ULTRAM) 50 MG tablet  Osteopenia of multiple sites - Plan: DG Bone Density  Anxiety - Plan: diazepam (VALIUM) 10 MG tablet  Anxiety state   Refilled all of patient's medications.  Her last bone density was in 2015 that showed osteopenia, we will repeat this now.  She will continue her above medications as above.  Decrease the patient's amount of Valium.  If she is continuing to need this much we may want to discuss a daily medication.  We do have to be careful because of her ulcerative colitis status.  She will recheck with me in 6 months.  She will need 1 additional refill of Ultram during that month.  She may need a refill of Valium during this time also.   Julie Hummingbird PA-C  [facility] 08/11/2017 2:24 PM

## 2017-08-11 NOTE — Patient Instructions (Signed)
     IF you received an x-ray today, you will receive an invoice from [facility]. Please contact [facility] at [phone] with questions or concerns regarding your invoice.   IF you received labwork today, you will receive an invoice from [facility]. Please contact [facility] at [phone] with questions or concerns regarding your invoice.   Our billing staff will not be able to assist you with questions regarding bills from these companies.  You will be contacted with the lab results as soon as they are available. The fastest way to get your results is to activate your My Chart account. Instructions are located on the last page of this paperwork. If you have not heard from us regarding the results in 2 weeks, please contact this office.     

## 2017-10-14 ENCOUNTER — Other Ambulatory Visit: Payer: Self-pay | Admitting: Family Medicine

## 2017-10-14 DIAGNOSIS — M545 Low back pain: Secondary | ICD-10-CM

## 2017-10-16 NOTE — Telephone Encounter (Signed)
Refill of neurontin  LOV 08/11/17  S. Weber  [facility] 01/23/17  #90  5 refills  [place], [place]

## 2017-10-18 NOTE — Telephone Encounter (Signed)
Relation to pt: self  Call back number: [phone] Pharmacy:  [place], [place] [phone] (Phone) [phone] (Fax)    Reason for call:  Patient checking on the status of medication refill request, patient states she's complelty out, patient would like a follow up call regarding the status please advise

## 2017-10-19 ENCOUNTER — Other Ambulatory Visit: Payer: Self-pay

## 2017-10-20 ENCOUNTER — Telehealth: Payer: Self-pay | Admitting: Physician Assistant

## 2017-10-20 NOTE — Telephone Encounter (Signed)
Left a voicemail in regards to Ms. Julie Lee appt with Windell Hummingbird on 11/17/2017. The provider will not be in the office on that day and will need to be rescheduled.

## 2017-11-02 ENCOUNTER — Encounter: Payer: Self-pay | Admitting: Physician Assistant

## 2017-11-02 ENCOUNTER — Ambulatory Visit: Payer: BLUE CROSS/BLUE SHIELD | Admitting: Physician Assistant

## 2017-11-02 ENCOUNTER — Other Ambulatory Visit: Payer: Self-pay

## 2017-11-02 VITALS — BP 124/78 | HR 67 | Temp 98.3°F | Resp 18 | Ht 64.0 in | Wt 178.0 lb

## 2017-11-02 DIAGNOSIS — I10 Essential (primary) hypertension: Secondary | ICD-10-CM

## 2017-11-02 DIAGNOSIS — M1990 Unspecified osteoarthritis, unspecified site: Secondary | ICD-10-CM | POA: Diagnosis not present

## 2017-11-02 DIAGNOSIS — F419 Anxiety disorder, unspecified: Secondary | ICD-10-CM | POA: Diagnosis not present

## 2017-11-02 DIAGNOSIS — M8589 Other specified disorders of bone density and structure, multiple sites: Secondary | ICD-10-CM | POA: Diagnosis not present

## 2017-11-02 MED ORDER — ATENOLOL 50 MG PO TABS: 50.0000 mg | ORAL_TABLET | Freq: Every day | ORAL | 1 refills | Status: DC

## 2017-11-02 MED ORDER — DIAZEPAM 10 MG PO TABS: 10.0000 mg | ORAL_TABLET | Freq: Two times a day (BID) | ORAL | 0 refills | Status: DC | PRN

## 2017-11-02 MED ORDER — TRAMADOL HCL 50 MG PO TABS: 50.0000 mg | ORAL_TABLET | Freq: Two times a day (BID) | ORAL | 0 refills | Status: DC | PRN

## 2017-11-02 MED ORDER — LISINOPRIL 20 MG PO TABS: 20.0000 mg | ORAL_TABLET | Freq: Every day | ORAL | 1 refills | Status: DC

## 2017-11-02 NOTE — Progress Notes (Signed)
Julie Lee  MRN: [id number] DOB: 12/07/1955  PCP: Mancel Bale, PA-C  Chief Complaint  Patient presents with  . Hypertension    follow up   . Medication Refill    tramadol     Subjective:  Pt presents to clinic for medication refill.  HTN - well controlled - does not check at home -  OA - takes ultram due to UC Anxiety - recent flair of UC which she used increase valium use due to possible stress causing the flair - she has also noticed that she has increased anxiety whenever she gets sick due to now "fear of being sick" but this happens rarely  History is obtained by patient.  Review of Systems  Constitutional: Negative for chills and fever.  Eyes: Negative for visual disturbance.  Respiratory: Negative for shortness of breath.   Cardiovascular: Negative for chest pain, palpitations and leg swelling.  Musculoskeletal: Positive for arthralgias (due to OA).  Neurological: Negative for dizziness, light-headedness and headaches.    Patient Active Problem List   Diagnosis Date Noted  . Anxiety state 08/11/2017  . Closed fracture dislocation of left elbow joint, initial encounter 06/02/2016  . HTN (hypertension) 08/23/2012  . OA (osteoarthritis) 08/23/2012  . Ulcerative colitis, unspecified 08/23/2012    Current Outpatient Medications on File Prior to Visit  Medication Sig Dispense Refill  . balsalazide (COLAZAL) 750 MG capsule Take 3 capsules (2,250 mg total) by mouth 3 (three) times daily. 270 capsule 3  . cyclobenzaprine (FLEXERIL) 10 MG tablet Take 1 tablet (10 mg total) by mouth 2 (two) times daily as needed for muscle spasms. 180 tablet 5  . gabapentin (NEURONTIN) 300 MG capsule TAKE 1 CAPSULE BY MOUTH THREE TIMES DAILY 90 capsule 5  . Multiple Vitamins-Minerals (MULTIVITAMIN GUMMIES ADULT) CHEW Chew 1 tablet by mouth daily.     No current facility-administered medications on file prior to visit.     No Known Allergies  Past Medical History:  Diagnosis  Date  . Allergy   . Anemia   . Anxiety   . Arthritis   . Colitis   . Hypertension   . Kidney stones   . UTI (lower urinary tract infection)    Social History   Social History Narrative   Lives alone   Good relationship with son and granddaughter   Social History   Tobacco Use  . Smoking status: Never Smoker  . Smokeless tobacco: Never Used  Substance Use Topics  . Alcohol use: Yes    Comment: rarely  . Drug use: No   family history includes Colitis in her brother; Colon cancer (age of onset: 35) in her maternal uncle; Diabetes in her paternal grandfather and paternal uncle; Drug abuse in her brother; Heart disease in her father, maternal grandfather, and paternal grandfather; Hyperlipidemia in her mother; Kidney disease in her paternal uncle; Lung cancer in her father.     Objective:  BP 124/78   Pulse 67   Temp 98.3 F (36.8 C) (Oral)   Resp 18   Ht 5' 4"  (1.626 m)   Wt 178 lb (80.7 kg)   SpO2 96%   BMI 30.55 kg/m  Body mass index is 30.55 kg/m.  Wt Readings from Last 3 Encounters:  11/02/17 178 lb (80.7 kg)  08/11/17 179 lb 3.2 oz (81.3 kg)  04/26/17 185 lb (83.9 kg)    Physical Exam  Constitutional: She is oriented to person, place, and time. She appears well-developed and well-nourished.  HENT:  Head: Normocephalic and atraumatic.  Right Ear: Hearing and external ear normal.  Left Ear: Hearing and external ear normal.  Eyes: Conjunctivae are normal.  Neck: Normal range of motion.  Cardiovascular: Normal rate, regular rhythm and normal heart sounds.  No murmur heard. Pulmonary/Chest: Effort normal and breath sounds normal.  Musculoskeletal:       Right lower leg: She exhibits no edema.       Left lower leg: She exhibits no edema.  Neurological: She is alert and oriented to person, place, and time.  Skin: Skin is warm and dry.  Psychiatric: She has a normal mood and affect. Her behavior is normal. Judgment and thought content normal.  Vitals  reviewed.   Assessment and Plan :  Osteopenia of multiple sites - Plan: VITAMIN D 25 Hydroxy (Vit-D Deficiency, Fractures) - she takes calcium and Vit D daily  Osteoarthritis, unspecified osteoarthritis type, unspecified site - Plan: traMADol (ULTRAM) 50 MG tablet, diazepam (VALIUM) 10 MG tablet  Essential hypertension - Plan: CMP14+EGFR, atenolol (TENORMIN) 50 MG tablet, lisinopril (PRINIVIL,ZESTRIL) 20 MG tablet - well controlled  Anxiety - Plan: diazepam (VALIUM) 10 MG tablet - continue use - still taking care of her mother with her sister  Serum calcium elevated - Plan: CMP14+EGFR if still elevated we will add PTH  Refilled all her medications. - Valium and Ultram may need to be refilled -- before her 6 months f/u time frame.  Patient verbalized to me that they understand the following: diagnosis, what is being done for them, what to expect and what should be done at home.  Their questions have been answered.  See after visit summary for patient specific instructions.  Windell Hummingbird PA-C  [facility] 11/02/2017 10:21 AM  Please note: Portions of this report may have been transcribed using dragon voice recognition software. Every effort was made to ensure accuracy; however, inadvertent computerized transcription errors may be present.

## 2017-11-02 NOTE — Patient Instructions (Signed)
° ° ° °  If you have lab work done today you will be contacted with your lab results within the next 2 weeks.  If you have not heard from us then please contact us. The fastest way to get your results is to register for My Chart. ° ° °IF you received an x-ray today, you will receive an invoice from [facility]. Please contact [facility] at [phone] with questions or concerns regarding your invoice.  ° °IF you received labwork today, you will receive an invoice from [facility]. Please contact [facility] at [phone] with questions or concerns regarding your invoice.  ° °Our billing staff will not be able to assist you with questions regarding bills from these companies. ° °You will be contacted with the lab results as soon as they are available. The fastest way to get your results is to activate your My Chart account. Instructions are located on the last page of this paperwork. If you have not heard from us regarding the results in 2 weeks, please contact this office. °  ° ° ° °

## 2017-11-03 LAB — CMP14+EGFR
ALT: 18 IU/L (ref 0–32)
AST: 18 IU/L (ref 0–40)
Albumin/Globulin Ratio: 1.7 (ref 1.2–2.2)
Albumin: 3.9 g/dL (ref 3.6–4.8)
Alkaline Phosphatase: 74 IU/L (ref 39–117)
BUN/Creatinine Ratio: 18 (ref 12–28)
BUN: 13 mg/dL (ref 8–27)
Bilirubin Total: 0.2 mg/dL (ref 0.0–1.2)
CO2: 24 mmol/L (ref 20–29)
Calcium: 9.7 mg/dL (ref 8.7–10.3)
Chloride: 104 mmol/L (ref 96–106)
Creatinine, Ser: 0.74 mg/dL (ref 0.57–1.00)
GFR calc Af Amer: 99 mL/min/{1.73_m2} (ref >59)
GFR calc non Af Amer: 86 mL/min/{1.73_m2} (ref >59)
Globulin, Total: 2.3 g/dL (ref 1.5–4.5)
Glucose: 90 mg/dL (ref 65–99)
Potassium: 4.3 mmol/L (ref 3.5–5.2)
Sodium: 143 mmol/L (ref 134–144)
Total Protein: 6.2 g/dL (ref 6.0–8.5)

## 2017-11-03 LAB — VITAMIN D 25 HYDROXY (VIT D DEFICIENCY, FRACTURES): Vit D, 25-Hydroxy: 71.7 ng/mL (ref 30.0–100.0)

## 2017-11-07 ENCOUNTER — Telehealth: Payer: Self-pay | Admitting: Physician Assistant

## 2017-11-07 NOTE — Telephone Encounter (Signed)
Informed pt of labs.

## 2017-11-07 NOTE — Telephone Encounter (Signed)
Copied from [place] [id number]. Topic: General - Other >> Nov 07, 2017  8:46 AM Ivar Drape wrote: Reason for CRM:  Patient would like a return call with the results of her last labs

## 2017-11-17 ENCOUNTER — Ambulatory Visit: Payer: BLUE CROSS/BLUE SHIELD | Admitting: Physician Assistant

## 2018-02-06 ENCOUNTER — Other Ambulatory Visit: Payer: Self-pay | Admitting: Family Medicine

## 2018-02-06 DIAGNOSIS — M1990 Unspecified osteoarthritis, unspecified site: Secondary | ICD-10-CM

## 2018-02-06 NOTE — Telephone Encounter (Signed)
Former pt of Sarah,PA last ov 11/02/17, last filled 11/02/17 #270 no appt on file.

## 2018-02-06 NOTE — Telephone Encounter (Incomplete)
Copied from [place] [phone]. Topic: Quick Communication - Rx Refill/Question >> Feb 06, 2018 10:35 AM Windy Kalata wrote: Medication: traMADol (ULTRAM) 50 MG tablet [[id number]]  Dose: 50-100 mg Route: Oral     Has the patient contacted their pharmacy? Yes.   (Agent: If no, request that the patient contact the pharmacy for the refill.) (Agent: If yes, when and what did the pharmacy advise?) It was automated  Preferred Pharmacy (with phone number or street name): ***  Agent: Please be advised that RX refills may take up to 3 business days. We ask that you follow-up with your pharmacy.

## 2018-02-07 MED ORDER — TRAMADOL HCL 50 MG PO TABS: 50.0000 mg | ORAL_TABLET | Freq: Two times a day (BID) | ORAL | 1 refills | Status: DC | PRN

## 2018-02-07 NOTE — Telephone Encounter (Signed)
Last OV with previous PCP aug 2019 Stated at the [facility] that she would need refills prior to 6 month followup I am sending 30 days, with 1 refill she needs to establish care She was recommended to see Dr Bridget Hartshorn per last note

## 2018-02-08 NOTE — Telephone Encounter (Signed)
Called pt per Dr. Ardyth Gal note for med refill. LVM for pt to call the office and schedule an appt with Dr.'s Eliezer Mccoy or [facility]. Plan was to see Dr. Nolon Rod so if she would like, please schedule her with Nolon Rod before meds run out. No refills will be given until she establishes with a PCP. Thank you!

## 2018-03-30 ENCOUNTER — Ambulatory Visit (INDEPENDENT_AMBULATORY_CARE_PROVIDER_SITE_OTHER): Payer: BLUE CROSS/BLUE SHIELD | Admitting: Family Medicine

## 2018-03-30 ENCOUNTER — Encounter: Payer: Self-pay | Admitting: Family Medicine

## 2018-03-30 VITALS — BP 126/78 | HR 61 | Temp 98.7°F | Resp 17 | Ht 64.0 in | Wt 179.0 lb

## 2018-03-30 DIAGNOSIS — K5190 Ulcerative colitis, unspecified, without complications: Secondary | ICD-10-CM

## 2018-03-30 DIAGNOSIS — M8949 Other hypertrophic osteoarthropathy, multiple sites: Secondary | ICD-10-CM

## 2018-03-30 DIAGNOSIS — M150 Primary generalized (osteo)arthritis: Secondary | ICD-10-CM | POA: Diagnosis not present

## 2018-03-30 DIAGNOSIS — F411 Generalized anxiety disorder: Secondary | ICD-10-CM

## 2018-03-30 DIAGNOSIS — M1990 Unspecified osteoarthritis, unspecified site: Secondary | ICD-10-CM

## 2018-03-30 DIAGNOSIS — I10 Essential (primary) hypertension: Secondary | ICD-10-CM

## 2018-03-30 DIAGNOSIS — R109 Unspecified abdominal pain: Secondary | ICD-10-CM

## 2018-03-30 DIAGNOSIS — M8589 Other specified disorders of bone density and structure, multiple sites: Secondary | ICD-10-CM | POA: Diagnosis not present

## 2018-03-30 DIAGNOSIS — E2839 Other primary ovarian failure: Secondary | ICD-10-CM

## 2018-03-30 DIAGNOSIS — M159 Polyosteoarthritis, unspecified: Secondary | ICD-10-CM

## 2018-03-30 DIAGNOSIS — F419 Anxiety disorder, unspecified: Secondary | ICD-10-CM

## 2018-03-30 DIAGNOSIS — Z1239 Encounter for other screening for malignant neoplasm of breast: Secondary | ICD-10-CM

## 2018-03-30 LAB — POCT URINALYSIS DIP (MANUAL ENTRY)
Bilirubin, UA: NEGATIVE
Glucose, UA: NEGATIVE mg/dL
Ketones, POC UA: NEGATIVE mg/dL
Leukocytes, UA: NEGATIVE
Nitrite, UA: NEGATIVE
PROTEIN UA: NEGATIVE mg/dL
Spec Grav, UA: 1.02 (ref 1.010–1.025)
Urobilinogen, UA: 0.2 E.U./dL
pH, UA: 8 (ref 5.0–8.0)

## 2018-03-30 LAB — POC MICROSCOPIC URINALYSIS (UMFC): Mucus: ABSENT

## 2018-03-30 MED ORDER — LISINOPRIL 20 MG PO TABS: 20.0000 mg | ORAL_TABLET | Freq: Every day | ORAL | 1 refills | Status: DC

## 2018-03-30 MED ORDER — DIAZEPAM 10 MG PO TABS: 10.0000 mg | ORAL_TABLET | Freq: Two times a day (BID) | ORAL | 0 refills | Status: DC | PRN

## 2018-03-30 MED ORDER — GABAPENTIN 300 MG PO CAPS: 300.0000 mg | ORAL_CAPSULE | Freq: Three times a day (TID) | ORAL | 5 refills | Status: DC

## 2018-03-30 MED ORDER — PREDNISONE 10 MG (21) PO TBPK: ORAL_TABLET | ORAL | 0 refills | Status: AC

## 2018-03-30 MED ORDER — ATENOLOL 50 MG PO TABS: 50.0000 mg | ORAL_TABLET | Freq: Every day | ORAL | 1 refills | Status: DC

## 2018-03-30 MED ORDER — BALSALAZIDE DISODIUM 750 MG PO CAPS: 2250.0000 mg | ORAL_CAPSULE | Freq: Three times a day (TID) | ORAL | 3 refills | Status: DC

## 2018-03-30 MED ORDER — CYCLOBENZAPRINE HCL 10 MG PO TABS: 10.0000 mg | ORAL_TABLET | Freq: Two times a day (BID) | ORAL | 5 refills | Status: DC | PRN

## 2018-03-30 MED ORDER — TRAMADOL HCL 50 MG PO TABS: 50.0000 mg | ORAL_TABLET | Freq: Two times a day (BID) | ORAL | 1 refills | Status: DC | PRN

## 2018-03-30 NOTE — Patient Instructions (Addendum)
Follow up with Gastroenterology for Valium and other Ulcerative Colitis medications  Continue to follow up here for women's health, arthritis and blood pressure management.   I have refilled all your medications today so that you do not run out.   We will contact you once your results are finalized.   Call for a mammogram  We recommend that you schedule a mammogram for breast cancer screening. Typically, you do not need a referral to do this. Please contact a local imaging center to schedule your mammogram.  [facility] - [phone]  *ask for the Radiology [place] [facility] - [phone] or [phone]  [facility] - [phone] [place] [phone] [facility] - [phone]  *ask for the [facility] - [phone]  *ask for the Radiology Department [facility] - [phone]  *ask for the [place] - [phone]    If you have lab work done today you will be contacted with your lab results within the next 2 weeks.  If you have not heard from [place] then please contact us. The fastest way to get your results is to register for My Chart.   IF you received an x-ray today, you will receive an invoice from [facility]. Please contact [facility] at [phone] with questions or concerns regarding your invoice.   IF you received labwork today, you will receive an invoice from [facility]. Please contact [facility] at [phone] with questions or concerns regarding your invoice.   Our billing staff will not be able to assist you with questions regarding bills from these companies.  You will be contacted with the lab results as soon as they are available. The fastest way to get your results is to activate your My Chart account. Instructions are located on the last page of this paperwork. If you have not  heard from [place] regarding the results in 2 weeks, please contact this office.

## 2018-03-30 NOTE — Progress Notes (Signed)
Established Patient Office Visit  Subjective:  Patient ID: Julie Lee, female    DOB: March 25, 1955  Age: 63 y.o. MRN: [id number]  CC:  Chief Complaint  Patient presents with  . Medication Refill    alll on list     HPI Julie Lee Band presents for  Hypertension: Patient here for follow-up of elevated blood pressure. She is not exercising and is adherent to low salt diet.  Blood pressure is not checked at home.  Cardiac symptoms none. Patient denies chest pain, claudication, dyspnea, exertional chest pressure/discomfort, irregular heart beat, lower extremity edema, near-syncope and orthopnea.  Cardiovascular risk factors: hypertension. Use of agents associated with hypertension: none. History of target organ damage: none. BP Readings from Last 3 Encounters:  03/30/18 126/78  11/02/17 124/78  08/11/17 114/68   Lab Results  Component Value Date   CREATININE 0.82 03/30/2018   Right flank pain She has some right low back pain She goes to a massage therapist weekly She states that she feels like she is trying to pass a kidney stone After working all day she feels a spasm She also has sometimes she feels like her bladder hurts like a toothache She states that she would like to get checked because she is not sure if it is her kidney She report that this has been ongoing for six months  Ulcerative Colitis She states that she was having bloody bowel movements this morning without cramping She states that she takes valium as needed for her ulcerative colitis which is impacted by stress She also takes balsalazide and tramadol for pain for her colitis She states that she missed work and reports that she drinks water She eats a very bland diet She states that she is not sure if this is all stress.  She sees Gastroenterology   Past Medical History:  Diagnosis Date  . Allergy   . Anemia   . Anxiety   . Arthritis   . Colitis   . Hypertension   . Kidney stones   . UTI (lower  urinary tract infection)     Past Surgical History:  Procedure Laterality Date  . CESAREAN SECTION    . COLONOSCOPY    . TUBAL LIGATION      Family History  Problem Relation Age of Onset  . Heart disease Father   . Lung cancer Father   . Hyperlipidemia Mother   . Colitis Brother   . Drug abuse Brother   . Colon cancer Maternal Uncle 46  . Diabetes Paternal Uncle   . Diabetes Paternal Grandfather   . Heart disease Paternal Grandfather   . Kidney disease Paternal Uncle   . Heart disease Maternal Grandfather     Social History   Socioeconomic History  . Marital status: Single    Spouse name: Not on file  . Number of children: Not on file  . Years of education: Not on file  . Highest education level: Not on file  Occupational History  . Not on file  Social Needs  . Financial resource strain: Not on file  . Food insecurity:    Worry: Not on file    Inability: Not on file  . Transportation needs:    Medical: Not on file    Non-medical: Not on file  Tobacco Use  . Smoking status: Never Smoker  . Smokeless tobacco: Never Used  Substance and Sexual Activity  . Alcohol use: Yes    Comment: rarely  . Drug use: No  .  Sexual activity: Never    Birth control/protection: Abstinence  Lifestyle  . Physical activity:    Days per week: Not on file    Minutes per session: Not on file  . Stress: Not on file  Relationships  . Social connections:    Talks on phone: Not on file    Gets together: Not on file    Attends religious service: Not on file    Active member of club or organization: Not on file    Attends meetings of clubs or organizations: Not on file    Relationship status: Not on file  . Intimate partner violence:    Fear of current or ex partner: Not on file    Emotionally abused: Not on file    Physically abused: Not on file    Forced sexual activity: Not on file  Other Topics Concern  . Not on file  Social History Narrative   Lives alone   Good  relationship with son and granddaughter    Outpatient Medications Prior to Visit  Medication Sig Dispense Refill  . Multiple Vitamins-Minerals (MULTIVITAMIN GUMMIES ADULT) CHEW Chew 1 tablet by mouth daily.    Marland Kitchen atenolol (TENORMIN) 50 MG tablet Take 1 tablet (50 mg total) by mouth daily. 90 tablet 1  . balsalazide (COLAZAL) 750 MG capsule Take 3 capsules (2,250 mg total) by mouth 3 (three) times daily. 270 capsule 3  . cyclobenzaprine (FLEXERIL) 10 MG tablet Take 1 tablet (10 mg total) by mouth 2 (two) times daily as needed for muscle spasms. 180 tablet 5  . diazepam (VALIUM) 10 MG tablet Take 1 tablet (10 mg total) by mouth every 12 (twelve) hours as needed. for anxiety 30 tablet 0  . gabapentin (NEURONTIN) 300 MG capsule TAKE 1 CAPSULE BY MOUTH THREE TIMES DAILY 90 capsule 5  . lisinopril (PRINIVIL,ZESTRIL) 20 MG tablet Take 1 tablet (20 mg total) by mouth daily. 90 tablet 1  . traMADol (ULTRAM) 50 MG tablet Take 1-2 tablets (50-100 mg total) by mouth every 12 (twelve) hours as needed. 90 tablet 1   No facility-administered medications prior to visit.     No Known Allergies  ROS Review of Systems  Review of Systems  Constitutional: Negative for activity change, appetite change, chills and fever.  HENT: Negative for congestion, nosebleeds, trouble swallowing and voice change.   Respiratory: Negative for cough, shortness of breath and wheezing.   Gastrointestinal: Negative for diarrhea, nausea and vomiting. See hpi Genitourinary: Negative for difficulty urinating, dysuria and hematuria. See hpi  Musculoskeletal: see hpi Neurological: Negative for dizziness, speech difficulty, light-headedness and numbness.  See HPI. All other review of systems negative.     Objective:    Physical Exam  BP 126/78   Pulse 61   Temp 98.7 F (37.1 C) (Oral)   Resp 17   Ht 5' 4"  (1.626 m)   Wt 179 lb (81.2 kg)   SpO2 98%   BMI 30.73 kg/m  Wt Readings from Last 3 Encounters:  03/30/18 179 lb  (81.2 kg)  11/02/17 178 lb (80.7 kg)  08/11/17 179 lb 3.2 oz (81.3 kg)    Physical Exam  Constitutional: Oriented to person, place, and time. Appears well-developed and well-nourished.  HENT:  Head: Normocephalic and atraumatic.  Eyes: Conjunctivae and EOM are normal.  Cardiovascular: Normal rate, regular rhythm, normal heart sounds and intact distal pulses.  No murmur heard. Pulmonary/Chest: Effort normal and breath sounds normal. No stridor. No respiratory distress. Has no wheezes.  Abdomen:  nondistended, normoactive bs, soft, nontender Neurological: Is alert and oriented to person, place, and time.  Skin: Skin is warm. Capillary refill takes less than 2 seconds.  Psychiatric: Has a normal mood and affect. Behavior is normal. Judgment and thought content normal.   Health Maintenance Due  Topic Date Due  . HIV Screening  04/08/1968  . MAMMOGRAM  05/21/2015  . PAP SMEAR-Modifier  08/24/2015  . INFLUENZA VACCINE  10/05/2017  . PNA vac Low Risk Adult (1 of 2 - PCV13) 04/08/2018    There are no preventive care reminders to display for this patient.  Lab Results  Component Value Date   TSH 1.770 04/26/2017   Lab Results  Component Value Date   WBC 7.0 03/30/2018   HGB 13.0 03/30/2018   HCT 41.4 03/30/2018   MCV 87 03/30/2018   PLT 347 03/30/2018   Lab Results  Component Value Date   NA 140 03/30/2018   K 4.5 03/30/2018   CO2 24 03/30/2018   GLUCOSE 90 03/30/2018   BUN 14 03/30/2018   CREATININE 0.82 03/30/2018   BILITOT 0.3 03/30/2018   ALKPHOS 76 03/30/2018   AST 16 03/30/2018   ALT 13 03/30/2018   PROT 6.7 03/30/2018   ALBUMIN 4.3 03/30/2018   CALCIUM 9.8 03/30/2018   Lab Results  Component Value Date   CHOL 225 (H) 03/30/2018   Lab Results  Component Value Date   HDL 59 03/30/2018   Lab Results  Component Value Date   LDLCALC 137 (H) 03/30/2018   Lab Results  Component Value Date   TRIG 147 03/30/2018   Lab Results  Component Value Date    CHOLHDL 3.8 03/30/2018   No results found for: HGBA1C    Assessment & Plan:   Problem List Items Addressed This Visit      Cardiovascular and Mediastinum   HTN (hypertension) - Primary  - Patient's blood pressure is at goal of 139/89 or less. Condition is stable. Continue current medications and treatment plan. I recommend that you exercise for 30-45 minutes 5 days a week. I also recommend a balanced diet with fruits and vegetables every day, lean meats, and little fried foods. The DASH diet (you can find this online) is a good example of this.    Relevant Medications   lisinopril (PRINIVIL,ZESTRIL) 20 MG tablet   atenolol (TENORMIN) 50 MG tablet   Other Relevant Orders   Comprehensive metabolic panel (Completed)   Lipid panel (Completed)     Musculoskeletal and Integument   OA (osteoarthritis)-  Continue current meds   Relevant Medications   cyclobenzaprine (FLEXERIL) 10 MG tablet   traMADol (ULTRAM) 50 MG tablet     Other   Anxiety state   Relevant Medications   diazepam (VALIUM) 10 MG tablet    Other Visit Diagnoses    Osteopenia of multiple sites       Relevant Orders   Comprehensive metabolic panel (Completed)   DG Bone Density   Ulcerative colitis without complications, unspecified location ([place])    -  Advised that since the valium is for her UC she should see her GI to see if he agrees with this plan but that this provider would not continue to refill valium for UC   Relevant Medications   balsalazide (COLAZAL) 750 MG capsule   Other Relevant Orders   CBC (Completed)   Right flank pain    -  Hematuria, pt to follow up with her Urologist   Relevant Orders  POCT urinalysis dipstick (Completed)   POCT Microscopic Urinalysis ([facility]) (Completed)   Estrogen deficiency    - will repeat bone density due to osteopenia   Relevant Orders   DG Bone Density   Breast cancer screening       Relevant Orders   MM Digital Screening   Anxiety    - refilled valium to prevent  withdrawal but discussed that I would not continue to manage this mediation   Relevant Medications   diazepam (VALIUM) 10 MG tablet      Meds ordered this encounter  Medications  . predniSONE (STERAPRED UNI-PAK 21 TAB) 10 MG (21) TBPK tablet    Sig: Take 6 tablets (60 mg total) by mouth daily for 1 day, THEN 5 tablets (50 mg total) daily for 1 day, THEN 4 tablets (40 mg total) daily for 1 day, THEN 3 tablets (30 mg total) daily for 1 day, THEN 2 tablets (20 mg total) daily for 1 day, THEN 1 tablet (10 mg total) daily for 1 day.    Dispense:  21 tablet    Refill:  0  . lisinopril (PRINIVIL,ZESTRIL) 20 MG tablet    Sig: Take 1 tablet (20 mg total) by mouth daily.    Dispense:  90 tablet    Refill:  1  . gabapentin (NEURONTIN) 300 MG capsule    Sig: Take 1 capsule (300 mg total) by mouth 3 (three) times daily.    Dispense:  90 capsule    Refill:  5    Please consider 90 day supplies to promote better adherence  . diazepam (VALIUM) 10 MG tablet    Sig: Take 1 tablet (10 mg total) by mouth every 12 (twelve) hours as needed. for anxiety    Dispense:  30 tablet    Refill:  0  . atenolol (TENORMIN) 50 MG tablet    Sig: Take 1 tablet (50 mg total) by mouth daily.    Dispense:  90 tablet    Refill:  1  . cyclobenzaprine (FLEXERIL) 10 MG tablet    Sig: Take 1 tablet (10 mg total) by mouth 2 (two) times daily as needed for muscle spasms.    Dispense:  180 tablet    Refill:  5  . balsalazide (COLAZAL) 750 MG capsule    Sig: Take 3 capsules (2,250 mg total) by mouth 3 (three) times daily.    Dispense:  270 capsule    Refill:  3  . traMADol (ULTRAM) 50 MG tablet    Sig: Take 1-2 tablets (50-100 mg total) by mouth every 12 (twelve) hours as needed.    Dispense:  90 tablet    Refill:  1    A total of 50 minutes were spent face-to-face with the patient during this encounter and over half of that time was spent on counseling and coordination of care. Spent discussing management of her  chronic diseases Review her previous mammogram and bone density Discussing anxiety treatment and its relation to UC and that there is some correlation but it does not require valium long term but once physiologic dependence occurs this can be a challenge Continue tramadol but not with flexeril Reviewed mammograms and ordered mammogram today   Follow-up: Return in about 6 months (around 09/28/2018) for hypertension .    Forrest Moron, MD

## 2018-03-31 LAB — COMPREHENSIVE METABOLIC PANEL
A/G RATIO: 1.8 (ref 1.2–2.2)
ALK PHOS: 76 IU/L (ref 39–117)
ALT: 13 IU/L (ref 0–32)
AST: 16 IU/L (ref 0–40)
Albumin: 4.3 g/dL (ref 3.8–4.8)
BUN / CREAT RATIO: 17 (ref 12–28)
BUN: 14 mg/dL (ref 8–27)
Bilirubin Total: 0.3 mg/dL (ref 0.0–1.2)
CALCIUM: 9.8 mg/dL (ref 8.7–10.3)
CO2: 24 mmol/L (ref 20–29)
Chloride: 104 mmol/L (ref 96–106)
Creatinine, Ser: 0.82 mg/dL (ref 0.57–1.00)
GFR, EST AFRICAN AMERICAN: 87 mL/min/{1.73_m2} (ref >59)
GFR, EST NON AFRICAN AMERICAN: 76 mL/min/{1.73_m2} (ref >59)
Globulin, Total: 2.4 g/dL (ref 1.5–4.5)
Glucose: 90 mg/dL (ref 65–99)
Potassium: 4.5 mmol/L (ref 3.5–5.2)
Sodium: 140 mmol/L (ref 134–144)
Total Protein: 6.7 g/dL (ref 6.0–8.5)

## 2018-03-31 LAB — CBC
Hematocrit: 41.4 % (ref 34.0–46.6)
Hemoglobin: 13 g/dL (ref 11.1–15.9)
MCH: 27.4 pg (ref 26.6–33.0)
MCHC: 31.4 g/dL — ABNORMAL LOW (ref 31.5–35.7)
MCV: 87 fL (ref 79–97)
Platelets: 347 10*3/uL (ref 150–450)
RBC: 4.75 x10E6/uL (ref 3.77–5.28)
RDW: 12.6 % (ref 11.7–15.4)
WBC: 7 10*3/uL (ref 3.4–10.8)

## 2018-03-31 LAB — LIPID PANEL
CHOL/HDL RATIO: 3.8 ratio (ref 0.0–4.4)
Cholesterol, Total: 225 mg/dL — ABNORMAL HIGH (ref 100–199)
HDL: 59 mg/dL (ref >39)
LDL CALC: 137 mg/dL — AB (ref 0–99)
Triglycerides: 147 mg/dL (ref 0–149)
VLDL CHOLESTEROL CAL: 29 mg/dL (ref 5–40)

## 2018-04-01 ENCOUNTER — Encounter: Payer: Self-pay | Admitting: Family Medicine

## 2018-06-11 ENCOUNTER — Telehealth: Payer: Self-pay | Admitting: Family Medicine

## 2018-06-11 NOTE — Telephone Encounter (Signed)
Pt. left voice message on refill line, stating her pharmacy sent a request to refill Tramadol one week ago, and has not been responded to.  Pt. Is inquiring about the status of this refill request.  Forwarding note to PCP office.

## 2018-06-12 NOTE — Telephone Encounter (Signed)
Appointment has been scheduled with PCP for refill request.

## 2018-06-19 ENCOUNTER — Telehealth: Payer: BLUE CROSS/BLUE SHIELD | Admitting: Family Medicine

## 2018-06-19 ENCOUNTER — Other Ambulatory Visit: Payer: Self-pay

## 2018-07-11 ENCOUNTER — Telehealth: Payer: Self-pay | Admitting: Family Medicine

## 2018-07-11 NOTE — Telephone Encounter (Signed)
Copied from [place] [id number]. Topic: Quick Communication - See Telephone Encounter >> Jul 11, 2018  3:51 PM Rutherford Nail, NT wrote: CRM for notification. See Telephone encounter for: 07/11/18. Rhea with [facility] [employer] calling and states that the patient's cyclobenzaprine (FLEXERIL) 10 MG tablet has been approved coverage starting 07/11/2018 and lasting until 07/11/2019. States that the member has been notified.  CB#: [phone]

## 2018-08-13 ENCOUNTER — Other Ambulatory Visit: Payer: Self-pay | Admitting: Family Medicine

## 2018-08-13 DIAGNOSIS — M1990 Unspecified osteoarthritis, unspecified site: Secondary | ICD-10-CM

## 2018-08-13 NOTE — Telephone Encounter (Signed)
Requested medication (s) are due for refill today: Yes  Requested medication (s) are on the active medication list: Yes  Last refill:  03/30/18  Future visit scheduled: No  Notes to clinic:  Unable to refill, cannot delegate     Requested Prescriptions  Pending Prescriptions Disp Refills   traMADol (ULTRAM) 50 MG tablet [Pharmacy Med Name: traMADol HCl 50 MG Oral Tablet] 90 tablet 0    Sig: TAKE 1 TO 2 TABLETS BY MOUTH EVERY 12 HOURS AS NEEDED     Not Delegated - Analgesics:  Opioid Agonists Failed - 08/13/2018  2:34 PM      Failed - This refill cannot be delegated      Failed - Urine Drug Screen completed in last 360 days.      Passed - Valid encounter within last 6 months    Recent Outpatient Visits          4 months ago Essential hypertension   [facility] at [facility], Arlie Solomons, MD   9 months ago Osteopenia of multiple sites   Primary [facility] at [facility], PA-C   1 year ago Essential hypertension   [facility], Damaris Hippo, PA-C   1 year ago Essential hypertension   Primary Care at [place], [place] [address], [place]   1 year ago Ulcerative colitis without complications, unspecified location [facility])   Primary Care at [place], [place] [address], [place]

## 2018-08-14 NOTE — Telephone Encounter (Signed)
Patient is requesting a refill of the following medications: Requested Prescriptions   Pending Prescriptions Disp Refills  . traMADol (ULTRAM) 50 MG tablet [Pharmacy Med Name: traMADol HCl 50 MG Oral Tablet] 90 tablet 0    Sig: TAKE 1 TO 2 TABLETS BY MOUTH EVERY 12 HOURS AS NEEDED    Date of patient request: 08/13/18 Last office visit:03/30/2018 Date of last refill:03/30/2018 Last refill amount: #90 with 1 refill Follow up time period per chart: n/a

## 2018-08-16 NOTE — Telephone Encounter (Signed)
Pt called requesting update on this medication please advise.

## 2018-08-16 NOTE — Telephone Encounter (Signed)
Patient is requesting a refill of the following medications: Requested Prescriptions   Pending Prescriptions Disp Refills  . traMADol (ULTRAM) 50 MG tablet [Pharmacy Med Name: traMADol HCl 50 MG Oral Tablet] 90 tablet 0    Sig: TAKE 1 TO 2 TABLETS BY MOUTH EVERY 12 HOURS AS NEEDED     Date of patient request: 08/13/18 Last office visit:03/30/2018 Date of last refill:03/30/2018 Last refill amount: #90 with 1 refill Follow up time period per chart: 09/28/18 was inicated in the avs, but no appointment was made.

## 2018-08-24 ENCOUNTER — Encounter: Payer: Self-pay | Admitting: Family Medicine

## 2018-08-24 ENCOUNTER — Other Ambulatory Visit: Payer: Self-pay

## 2018-08-24 ENCOUNTER — Ambulatory Visit (INDEPENDENT_AMBULATORY_CARE_PROVIDER_SITE_OTHER): Payer: Medicare Other | Admitting: Family Medicine

## 2018-08-24 VITALS — BP 145/78 | HR 63 | Temp 98.4°F | Resp 17 | Ht 64.0 in | Wt 183.8 lb

## 2018-08-24 DIAGNOSIS — K5190 Ulcerative colitis, unspecified, without complications: Secondary | ICD-10-CM

## 2018-08-24 DIAGNOSIS — M150 Primary generalized (osteo)arthritis: Secondary | ICD-10-CM

## 2018-08-24 DIAGNOSIS — E2839 Other primary ovarian failure: Secondary | ICD-10-CM | POA: Diagnosis not present

## 2018-08-24 DIAGNOSIS — M8589 Other specified disorders of bone density and structure, multiple sites: Secondary | ICD-10-CM | POA: Diagnosis not present

## 2018-08-24 DIAGNOSIS — Z1239 Encounter for other screening for malignant neoplasm of breast: Secondary | ICD-10-CM

## 2018-08-24 DIAGNOSIS — M159 Polyosteoarthritis, unspecified: Secondary | ICD-10-CM

## 2018-08-24 DIAGNOSIS — Z87442 Personal history of urinary calculi: Secondary | ICD-10-CM | POA: Diagnosis not present

## 2018-08-24 DIAGNOSIS — Z0289 Encounter for other administrative examinations: Secondary | ICD-10-CM | POA: Insufficient documentation

## 2018-08-24 DIAGNOSIS — F419 Anxiety disorder, unspecified: Secondary | ICD-10-CM | POA: Diagnosis not present

## 2018-08-24 DIAGNOSIS — M8949 Other hypertrophic osteoarthropathy, multiple sites: Secondary | ICD-10-CM

## 2018-08-24 LAB — POCT URINALYSIS DIP (MANUAL ENTRY)
Bilirubin, UA: NEGATIVE
Glucose, UA: NEGATIVE mg/dL
Ketones, POC UA: NEGATIVE mg/dL
Leukocytes, UA: NEGATIVE
Nitrite, UA: NEGATIVE
Protein Ur, POC: NEGATIVE mg/dL
Spec Grav, UA: 1.02 (ref 1.010–1.025)
Urobilinogen, UA: 0.2 E.U./dL
pH, UA: 7 (ref 5.0–8.0)

## 2018-08-24 LAB — POC MICROSCOPIC URINALYSIS (UMFC): Mucus: ABSENT

## 2018-08-24 MED ORDER — DIAZEPAM 10 MG PO TABS: 10.0000 mg | ORAL_TABLET | Freq: Two times a day (BID) | ORAL | 0 refills | Status: DC | PRN

## 2018-08-24 NOTE — Patient Instructions (Signed)
° ° ° °  If you have lab work done today you will be contacted with your lab results within the next 2 weeks.  If you have not heard from us then please contact us. The fastest way to get your results is to register for My Chart. ° ° °IF you received an x-ray today, you will receive an invoice from [facility]. Please contact [facility] at [phone] with questions or concerns regarding your invoice.  ° °IF you received labwork today, you will receive an invoice from [facility]. Please contact [facility] at [phone] with questions or concerns regarding your invoice.  ° °Our billing staff will not be able to assist you with questions regarding bills from these companies. ° °You will be contacted with the lab results as soon as they are available. The fastest way to get your results is to activate your My Chart account. Instructions are located on the last page of this paperwork. If you have not heard from us regarding the results in 2 weeks, please contact this office. °  ° ° ° °

## 2018-08-24 NOTE — Progress Notes (Signed)
Established Patient Office Visit  Subjective:  Patient ID: Julie Lee, female    DOB: June 01, 1955  Age: 63 y.o. MRN: [id number]  CC:  Chief Complaint  Patient presents with  . Medication Refill    tramadol and diazepam    HPI Julie Lee presents for   Ulcerative Colitis Reports that she had diarrhea 2 days ago  She increased her Colazal which helps her UC She denies fevers or chills She avoids food irritants that worsen her UC  Osteoarthritis Patient has chronic pain from osteoarthritis She reports that she is on tramadol for pain in multiple joints She states that she cannot take ibuprofen due to her ulcerative colitis She reports that she has been on tramadol for years She hs been on it since age 42  Anxiety She is due for valium refill She reports that she takes the valium when she gets anxiety because she gets bloody diarrhea due to her UC She was given a prescription in January for 30 tabs of valium and it is lasting her six months just about.   She states that covid has increased her anxiety  Depression screen [facility] 2/9 08/24/2018 03/30/2018 08/11/2017 04/26/2017 10/03/2016  Decreased Interest 0 0 0 0 2  Down, Depressed, Hopeless 0 0 0 0 1  PHQ - 2 Score 0 0 0 0 3  Altered sleeping - 0 - - 0  Tired, decreased energy - 0 - - 1  Change in appetite - 0 - - 3  Feeling bad or failure about yourself  - 0 - - 3  Trouble concentrating - 0 - - 2  Moving slowly or fidgety/restless - 0 - - 2  Suicidal thoughts - 0 - - 0  PHQ-9 Score - 0 - - 14  Difficult doing work/chores - Not difficult at all - - Somewhat difficult     Past Medical History:  Diagnosis Date  . Allergy   . Anemia   . Anxiety   . Arthritis   . Colitis   . Hypertension   . Kidney stones   . UTI (lower urinary tract infection)     Past Surgical History:  Procedure Laterality Date  . CESAREAN SECTION    . COLONOSCOPY    . TUBAL LIGATION      Family History  Problem Relation Age of Onset   . Heart disease Father   . Lung cancer Father   . Hyperlipidemia Mother   . Colitis Brother   . Drug abuse Brother   . Colon cancer Maternal Uncle 34  . Diabetes Paternal Uncle   . Diabetes Paternal Grandfather   . Heart disease Paternal Grandfather   . Kidney disease Paternal Uncle   . Heart disease Maternal Grandfather     Social History   Socioeconomic History  . Marital status: Single    Spouse name: Not on file  . Number of children: Not on file  . Years of education: Not on file  . Highest education level: Not on file  Occupational History  . Not on file  Social Needs  . Financial resource strain: Not on file  . Food insecurity    Worry: Not on file    Inability: Not on file  . Transportation needs    Medical: Not on file    Non-medical: Not on file  Tobacco Use  . Smoking status: Never Smoker  . Smokeless tobacco: Never Used  Substance and Sexual Activity  . Alcohol use: Yes  Comment: rarely  . Drug use: No  . Sexual activity: Never    Birth control/protection: Abstinence  Lifestyle  . Physical activity    Days per week: Not on file    Minutes per session: Not on file  . Stress: Not on file  Relationships  . Social Herbalist on phone: Not on file    Gets together: Not on file    Attends religious service: Not on file    Active member of club or organization: Not on file    Attends meetings of clubs or organizations: Not on file    Relationship status: Not on file  . Intimate partner violence    Fear of current or ex partner: Not on file    Emotionally abused: Not on file    Physically abused: Not on file    Forced sexual activity: Not on file  Other Topics Concern  . Not on file  Social History Narrative   Lives alone   Good relationship with son and granddaughter    Outpatient Medications Prior to Visit  Medication Sig Dispense Refill  . atenolol (TENORMIN) 50 MG tablet Take 1 tablet (50 mg total) by mouth daily. 90 tablet 1  .  balsalazide (COLAZAL) 750 MG capsule Take 3 capsules (2,250 mg total) by mouth 3 (three) times daily. 270 capsule 3  . cyclobenzaprine (FLEXERIL) 10 MG tablet Take 1 tablet (10 mg total) by mouth 2 (two) times daily as needed for muscle spasms. 180 tablet 5  . gabapentin (NEURONTIN) 300 MG capsule Take 1 capsule (300 mg total) by mouth 3 (three) times daily. 90 capsule 5  . lisinopril (PRINIVIL,ZESTRIL) 20 MG tablet Take 1 tablet (20 mg total) by mouth daily. 90 tablet 1  . Multiple Vitamins-Minerals (MULTIVITAMIN GUMMIES ADULT) CHEW Chew 1 tablet by mouth daily.    . traMADol (ULTRAM) 50 MG tablet TAKE 1 TO 2 TABLETS BY MOUTH EVERY 12 HOURS AS NEEDED 90 tablet 1  . diazepam (VALIUM) 10 MG tablet Take 1 tablet (10 mg total) by mouth every 12 (twelve) hours as needed. for anxiety 30 tablet 0   No facility-administered medications prior to visit.     No Known Allergies  ROS Review of Systems Review of Systems  Constitutional: Negative for activity change, appetite change, chills and fever.  HENT: Negative for congestion, nosebleeds, trouble swallowing and voice change.   Respiratory: Negative for cough, shortness of breath and wheezing.   Gastrointestinal: Negative for nausea and vomiting.  Genitourinary: Negative for difficulty urinating, dysuria, flank pain and hematuria.  Musculoskeletal: Negative for back pain, joint swelling and neck pain.  Neurological: Negative for dizziness, speech difficulty, light-headedness and numbness.  See HPI. All other review of systems negative.     Objective:    Physical Exam  BP (!) 145/78 (BP Location: Right Arm, Patient Position: Sitting, Cuff Size: Normal)   Pulse 63   Temp 98.4 F (36.9 C) (Oral)   Resp 17   Ht 5' 4"  (1.626 m)   Wt 183 lb 12.8 oz (83.4 kg)   SpO2 93%   BMI 31.55 kg/m  Wt Readings from Last 3 Encounters:  08/24/18 183 lb 12.8 oz (83.4 kg)  03/30/18 179 lb (81.2 kg)  11/02/17 178 lb (80.7 kg)   Physical Exam   Constitutional: Oriented to person, place, and time. Appears well-developed and well-nourished.  HENT:  Head: Normocephalic and atraumatic.  Eyes: Conjunctivae and EOM are normal.  Cardiovascular: Normal rate, regular rhythm,  normal heart sounds and intact distal pulses.  No murmur heard. Pulmonary/Chest: Effort normal and breath sounds normal. No stridor. No respiratory distress. Has no wheezes.  Abdomen: nondistended, normoactive bs, soft, nontender, no suprapubic tenderness  Neurological: Is alert and oriented to person, place, and time.  Skin: Skin is warm. Capillary refill takes less than 2 seconds.  Psychiatric: Has a normal mood and affect. Behavior is normal. Judgment and thought content normal.    Health Maintenance Due  Topic Date Due  . HIV Screening  04/08/1968  . MAMMOGRAM  05/21/2015  . PAP SMEAR-Modifier  08/24/2015  . PNA vac Low Risk Adult (1 of 2 - PCV13) 04/08/2018    There are no preventive care reminders to display for this patient.  Lab Results  Component Value Date   TSH 1.770 04/26/2017   Lab Results  Component Value Date   WBC 7.0 03/30/2018   HGB 13.0 03/30/2018   HCT 41.4 03/30/2018   MCV 87 03/30/2018   PLT 347 03/30/2018   Lab Results  Component Value Date   NA 140 03/30/2018   K 4.5 03/30/2018   CO2 24 03/30/2018   GLUCOSE 90 03/30/2018   BUN 14 03/30/2018   CREATININE 0.82 03/30/2018   BILITOT 0.3 03/30/2018   ALKPHOS 76 03/30/2018   AST 16 03/30/2018   ALT 13 03/30/2018   PROT 6.7 03/30/2018   ALBUMIN 4.3 03/30/2018   CALCIUM 9.8 03/30/2018   Lab Results  Component Value Date   CHOL 225 (H) 03/30/2018   Lab Results  Component Value Date   HDL 59 03/30/2018   Lab Results  Component Value Date   LDLCALC 137 (H) 03/30/2018   Lab Results  Component Value Date   TRIG 147 03/30/2018   Lab Results  Component Value Date   CHOLHDL 3.8 03/30/2018   No results found for: HGBA1C    Assessment & Plan:   Problem List Items  Addressed This Visit      Musculoskeletal and Integument   OA (osteoarthritis) - nsaid intolerant, compliant with tramadol, dependent on tramadol   Osteopenia of multiple sites     Other   Estrogen deficiency - Primary  - will check bone density, pt has previous history of osteopenia   Relevant Orders   DG Bone Density   Pain medication agreement signed - sent for scan    Other Visit Diagnoses    Anxiety       Relevant Medications   diazepam (VALIUM) 10 MG tablet   Ulcerative colitis without complications, unspecified location ([place])     Pt will call GI for follow up  Last appt was 5 years ago   Relevant Medications   diazepam (VALIUM) 10 MG tablet   History of kidney stones    - will refer to Urolgy Pt also concerned about bladder cancer due to her chronic use of zantac    Relevant Orders   Ambulatory referral to Urology   POCT urinalysis dipstick   POCT Microscopic Urinalysis ([facility])      Meds ordered this encounter  Medications  . diazepam (VALIUM) 10 MG tablet    Sig: Take 1 tablet (10 mg total) by mouth every 12 (twelve) hours as needed. for anxiety    Dispense:  30 tablet    Refill:  0    Follow-up: Return in about 6 months (around 02/23/2019) for tramadol refill .    Forrest Moron, MD

## 2018-08-31 NOTE — Telephone Encounter (Signed)
Pt had appt on 08/24/2018 with Nolon Rod

## 2018-10-11 DIAGNOSIS — R1084 Generalized abdominal pain: Secondary | ICD-10-CM | POA: Diagnosis not present

## 2018-10-11 DIAGNOSIS — M5136 Other intervertebral disc degeneration, lumbar region: Secondary | ICD-10-CM | POA: Diagnosis not present

## 2018-10-11 DIAGNOSIS — R310 Gross hematuria: Secondary | ICD-10-CM | POA: Diagnosis not present

## 2018-10-11 DIAGNOSIS — R109 Unspecified abdominal pain: Secondary | ICD-10-CM | POA: Diagnosis not present

## 2018-10-29 ENCOUNTER — Other Ambulatory Visit: Payer: Self-pay | Admitting: Family Medicine

## 2018-10-29 DIAGNOSIS — M1990 Unspecified osteoarthritis, unspecified site: Secondary | ICD-10-CM

## 2018-10-29 NOTE — Telephone Encounter (Signed)
Patient is requesting a refill of the following medications: Requested Prescriptions   Pending Prescriptions Disp Refills  . traMADol (ULTRAM) 50 MG tablet [Pharmacy Med Name: traMADol HCl 50 MG Oral Tablet] 90 tablet 0    Sig: TAKE 1 TO 2 TABLETS BY MOUTH EVERY 12 HOURS AS NEEDED    Date of patient request: 10/29/2018 Last office visit: 08/24/2018 Date of last refill: 08/20/2018 Last refill amount:#90 with 1 refill Follow up time period per chart: n/a

## 2018-11-16 ENCOUNTER — Other Ambulatory Visit: Payer: Self-pay

## 2018-11-16 ENCOUNTER — Telehealth: Payer: Self-pay | Admitting: Family Medicine

## 2018-11-16 DIAGNOSIS — I10 Essential (primary) hypertension: Secondary | ICD-10-CM

## 2018-11-16 MED ORDER — LISINOPRIL 20 MG PO TABS: 20.0000 mg | ORAL_TABLET | Freq: Every day | ORAL | 1 refills | Status: DC

## 2018-11-16 MED ORDER — ATENOLOL 50 MG PO TABS: 50.0000 mg | ORAL_TABLET | Freq: Every day | ORAL | 1 refills | Status: DC

## 2018-11-16 NOTE — Telephone Encounter (Signed)
Patient is calling because she is out of medication as of today and needs the refills immediately  [place] on [address]

## 2018-11-19 NOTE — Telephone Encounter (Signed)
Medication was refilled.

## 2018-11-23 DIAGNOSIS — N8111 Cystocele, midline: Secondary | ICD-10-CM | POA: Diagnosis not present

## 2018-11-23 DIAGNOSIS — N952 Postmenopausal atrophic vaginitis: Secondary | ICD-10-CM | POA: Diagnosis not present

## 2018-11-23 DIAGNOSIS — R310 Gross hematuria: Secondary | ICD-10-CM | POA: Diagnosis not present

## 2018-12-09 ENCOUNTER — Other Ambulatory Visit: Payer: Self-pay | Admitting: Family Medicine

## 2018-12-09 DIAGNOSIS — M1990 Unspecified osteoarthritis, unspecified site: Secondary | ICD-10-CM

## 2018-12-14 NOTE — Telephone Encounter (Signed)
° °  Pt said she need her medication she is out   traMADol (ULTRAM) 50 MG tablet

## 2018-12-14 NOTE — Telephone Encounter (Signed)
Please advise on refill, Tramadol.  Do you want pt to make a f/u appt prior to refills?

## 2019-01-15 ENCOUNTER — Other Ambulatory Visit: Payer: Self-pay

## 2019-01-15 ENCOUNTER — Ambulatory Visit (INDEPENDENT_AMBULATORY_CARE_PROVIDER_SITE_OTHER): Payer: Medicare Other | Admitting: Family Medicine

## 2019-01-15 ENCOUNTER — Other Ambulatory Visit (HOSPITAL_COMMUNITY)
Admission: RE | Admit: 2019-01-15 | Discharge: 2019-01-15 | Disposition: A | Discharge status: Home / Self Care | Payer: Medicare Other | Source: Ambulatory Visit | Attending: Family Medicine | Admitting: Family Medicine

## 2019-01-15 VITALS — BP 134/81 | HR 63 | Temp 98.4°F | Ht 64.0 in | Wt 184.0 lb

## 2019-01-15 DIAGNOSIS — N950 Postmenopausal bleeding: Secondary | ICD-10-CM | POA: Diagnosis not present

## 2019-01-15 DIAGNOSIS — Z0000 Encounter for general adult medical examination without abnormal findings: Secondary | ICD-10-CM

## 2019-01-15 DIAGNOSIS — Z124 Encounter for screening for malignant neoplasm of cervix: Secondary | ICD-10-CM | POA: Diagnosis not present

## 2019-01-15 DIAGNOSIS — Z780 Asymptomatic menopausal state: Secondary | ICD-10-CM | POA: Insufficient documentation

## 2019-01-15 DIAGNOSIS — Z1151 Encounter for screening for human papillomavirus (HPV): Secondary | ICD-10-CM

## 2019-01-15 DIAGNOSIS — Z23 Encounter for immunization: Secondary | ICD-10-CM

## 2019-01-15 DIAGNOSIS — I10 Essential (primary) hypertension: Secondary | ICD-10-CM | POA: Diagnosis not present

## 2019-01-15 DIAGNOSIS — K5190 Ulcerative colitis, unspecified, without complications: Secondary | ICD-10-CM

## 2019-01-15 DIAGNOSIS — Z13820 Encounter for screening for osteoporosis: Secondary | ICD-10-CM

## 2019-01-15 DIAGNOSIS — E2839 Other primary ovarian failure: Secondary | ICD-10-CM | POA: Diagnosis not present

## 2019-01-15 DIAGNOSIS — Z0001 Encounter for general adult medical examination with abnormal findings: Secondary | ICD-10-CM

## 2019-01-15 DIAGNOSIS — Z1231 Encounter for screening mammogram for malignant neoplasm of breast: Secondary | ICD-10-CM

## 2019-01-15 MED ORDER — TRAMADOL HCL 50 MG PO TABS: ORAL_TABLET | ORAL | 0 refills | Status: DC

## 2019-01-15 MED ORDER — DIAZEPAM 10 MG PO TABS: 10.0000 mg | ORAL_TABLET | Freq: Two times a day (BID) | ORAL | 0 refills | Status: DC | PRN

## 2019-01-15 MED ORDER — MESALAMINE 1.2 G PO TBEC: 2.4000 g | DELAYED_RELEASE_TABLET | Freq: Every day | ORAL | 11 refills | Status: DC

## 2019-01-15 MED ORDER — LISINOPRIL 20 MG PO TABS: 20.0000 mg | ORAL_TABLET | Freq: Every day | ORAL | 1 refills | Status: DC

## 2019-01-15 NOTE — Progress Notes (Signed)
QUICK REFERENCE INFORMATION:  The ABCs of Providing the Initial Preventive Physical Examination CMS.gov Medicare Learning Network  Welcome To [employer] Physical Julie Lee is a 63 y.o. female who presents for a Welcome to Medicare exam.    Ulcerative Colitis She takes valium for her UC She states that her medication  She denies blood per rectum  Vaginal dryness Postmenopausal bleeding Dr. Sandrea Matte put her on a sample of vaginal estrogen  She had an episode of vaginal bleeding for one day 2 weeks ago  She reports that she stopped the estrogen and the bleeding stopped She used Replens vaginal rinse after seeing the bleeding   Patient Active Problem List   Diagnosis Date Noted  . Osteopenia of multiple sites 08/24/2018  . Estrogen deficiency 08/24/2018  . Pain medication agreement signed 08/24/2018  . Anxiety state 08/11/2017  . Closed fracture dislocation of left elbow joint, initial encounter 06/02/2016  . HTN (hypertension) 08/23/2012  . OA (osteoarthritis) 08/23/2012  . Ulcerative colitis, unspecified 08/23/2012    Past Medical History:  Diagnosis Date  . Allergy   . Anemia   . Anxiety   . Arthritis   . Colitis   . Hypertension   . Kidney stones   . UTI (lower urinary tract infection)     Past Surgical History:  Procedure Laterality Date  . CESAREAN SECTION    . COLONOSCOPY    . TUBAL LIGATION       Outpatient Medications Prior to Visit  Medication Sig Dispense Refill  . atenolol (TENORMIN) 50 MG tablet Take 1 tablet (50 mg total) by mouth daily. 90 tablet 1  . balsalazide (COLAZAL) 750 MG capsule Take 3 capsules (2,250 mg total) by mouth 3 (three) times daily. 270 capsule 3  . cyclobenzaprine (FLEXERIL) 10 MG tablet Take 1 tablet (10 mg total) by mouth 2 (two) times daily as needed for muscle spasms. 180 tablet 5  . gabapentin (NEURONTIN) 300 MG capsule Take 1 capsule (300 mg total) by mouth 3 (three) times daily. 90 capsule 5  . Multiple Vitamins-Minerals  (MULTIVITAMIN GUMMIES ADULT) CHEW Chew 1 tablet by mouth daily.    . diazepam (VALIUM) 10 MG tablet Take 1 tablet (10 mg total) by mouth every 12 (twelve) hours as needed. for anxiety 30 tablet 0  . lisinopril (ZESTRIL) 20 MG tablet Take 1 tablet (20 mg total) by mouth daily. 90 tablet 1  . traMADol (ULTRAM) 50 MG tablet TAKE 1 TO 2 TABLETS BY MOUTH EVERY 12 HOURS AS NEEDED 90 tablet 0   No facility-administered medications prior to visit.     No Known Allergies   Family History  Problem Relation Age of Onset  . Heart disease Father   . Lung cancer Father   . Hyperlipidemia Mother   . Colitis Brother   . Drug abuse Brother   . Colon cancer Maternal Uncle 30  . Diabetes Paternal Uncle   . Diabetes Paternal Grandfather   . Heart disease Paternal Grandfather   . Kidney disease Paternal Uncle   . Heart disease Maternal Grandfather      Social History   Socioeconomic History  . Marital status: Single    Spouse name: Not on file  . Number of children: Not on file  . Years of education: Not on file  . Highest education level: Not on file  Occupational History  . Not on file  Social Needs  . Financial resource strain: Not on file  . Food insecurity  Worry: Not on file    Inability: Not on file  . Transportation needs    Medical: Not on file    Non-medical: Not on file  Tobacco Use  . Smoking status: Never Smoker  . Smokeless tobacco: Never Used  Substance and Sexual Activity  . Alcohol use: Yes    Comment: rarely  . Drug use: No  . Sexual activity: Never    Birth control/protection: Abstinence  Lifestyle  . Physical activity    Days per week: Not on file    Minutes per session: Not on file  . Stress: Not on file  Relationships  . Social Herbalist on phone: Not on file    Gets together: Not on file    Attends religious service: Not on file    Active member of club or organization: Not on file    Attends meetings of clubs or organizations: Not on  file    Relationship status: Not on file  . Intimate partner violence    Fear of current or ex partner: Not on file    Emotionally abused: Not on file    Physically abused: Not on file    Forced sexual activity: Not on file  Other Topics Concern  . Not on file  Social History Narrative   Lives alone   Good relationship with son and granddaughter     Review of Systems  Constitutional: Negative for chills and fever.  Respiratory: Negative for cough, shortness of breath and wheezing.   Cardiovascular: Negative for chest pain and palpitations.  Gastrointestinal: Negative for blood in stool, nausea and vomiting.  Genitourinary: Negative for frequency and urgency.  Skin: Negative for itching and rash.  Neurological: Negative for dizziness, tingling and headaches.    Recent Hospitalizations? No  Current Medical Providers and Suppliers: [facility] Patient Care Team: Forrest Moron, MD as PCP - General (Internal Medicine) Milus Banister, MD as Attending Physician (Gastroenterology) No future appointments.  Age-appropriate Screening Schedule: The list below includes current immunization status and future screening recommendations based on patient's age. Orders for these recommended tests are listed in the plan section. The patient has been provided with a written plan. Immunization History  Administered Date(s) Administered  . Influenza, Quadrivalent, Recombinant, Inj, Pf 12/30/2017  . Influenza,inj,Quad PF,6+ Mos 12/18/2013, 04/26/2017  . Influenza-Unspecified 01/12/2016  . Pneumococcal Conjugate-13 01/15/2019  . Tdap 08/23/2012   Health Maintenance  Topic Date Due  . MAMMOGRAM  05/21/2015  . PAP SMEAR-Modifier  08/24/2015  . PNA vac Low Risk Adult (1 of 2 - PCV13) 04/08/2018  . HIV Screening  01/15/2020 (Originally 04/08/1968)  . TETANUS/TDAP  08/24/2022  . COLONOSCOPY  08/10/2023  . INFLUENZA VACCINE  Completed  . DEXA SCAN  Completed  . Hepatitis C Screening  Completed     Health Habits  Exercise: 7 times/week. Current exercise activities include: walking Diet: in general, a "healthy" diet      Depression Screen-PHQ2/9 completed today  Depression screen [facility] 2/9 01/15/2019 08/24/2018 03/30/2018 08/11/2017 04/26/2017  Decreased Interest 0 0 0 0 0  Down, Depressed, Hopeless 0 0 0 0 0  PHQ - 2 Score 0 0 0 0 0  Altered sleeping - - 0 - -  Tired, decreased energy - - 0 - -  Change in appetite - - 0 - -  Feeling bad or failure about yourself  - - 0 - -  Trouble concentrating - - 0 - -  Moving slowly or fidgety/restless - -  0 - -  Suicidal thoughts - - 0 - -  PHQ-9 Score - - 0 - -  Difficult doing work/chores - - Not difficult at all - -     Depression Severity and Treatment Recommendations:  0-4= None  5-9= Mild / Treatment: Support, educate to call if worse; return in one month  10-14= Moderate / Treatment: Support, watchful waiting; Antidepressant or Psycotherapy  15-19= Moderately severe / Treatment: Antidepressant OR Psychotherapy  >= 20 = Major depression, severe / Antidepressant AND Psychotherapy  Functional Ability  Does the patient need help with: Ron Parker index)  Bathing: no  Dressing : no  Toileting: no Transferring: no Feeding: no Does the patient have issues with incontinence: no  Safety Screen  Does the home have:  Rugs in the hallway: no Stairs in home: no Handrails on the stairs: no Poor lighting: no   Hearing Evaluation  Do you have trouble hearing the television when others do not? no  Do you have to strain to hear/understand conversations? no    Falls Risk:  Does the patient need assistance with ambulation? no  Does the patient have a history of a fall in the last 90 days? no Is the patient at risk for falls? no Was the patient's timed "Get Up and Go Test" unsteady or longer than 30 seconds? no   Advanced Care Planning Patient has executed an Advance Directive: no  If no, patient was given the opportunity to execute  an Advance Directive today? yes  Are the patient's advanced directives in [place]? no  This patient has the ability to prepare an Advance Directive: yes Provider is willing to follow the patient's wishes: yes   Cognitive Assessment Does the patient have evidence of cognitive impairment? no The patient does not have evidence of a change in mood/affect, appearance,  speech, memory or motor skills.   Objective:   Vitals:   01/15/19 1115  BP: 134/81  Pulse: 63  Temp: 98.4 F (36.9 C)  SpO2: 94%  Weight: 184 lb (83.5 kg)  Height: 5' 4"  (1.626 m)    Body mass index is 31.58 kg/m.    Hearing/Vision exam:  Hearing Screening   125Hz  250Hz  500Hz  1000Hz  2000Hz  3000Hz  4000Hz  6000Hz  8000Hz   Right ear:           Left ear:             Visual Acuity Screening   Right eye Left eye Both eyes  Without correction:     With correction: 20/20 20/20 20/20    BP 134/81   Pulse 63   Temp 98.4 F (36.9 C)   Ht 5' 4"  (1.626 m)   Wt 184 lb (83.5 kg)   SpO2 94%   BMI 31.58 kg/m   General Appearance:    Alert, cooperative, no distress, appears stated age  Head:    Normocephalic, without obvious abnormality, atraumatic  Eyes:    conjunctiva/corneas clear, EOM's intact  Ears:    Normal TM's and external ear canals, both ears  Nose:   Nares normal, septum midline, mucosa normal, no drainage    or sinus tenderness  Throat:   Lips, mucosa, and tongue normal; teeth and gums normal  Neck:   Supple, symmetrical, trachea midline, no adenopathy;    thyroid:  no enlargement/tenderness/nodules  Back:     Symmetric, no curvature, ROM normal, no CVA tenderness  Lungs:     Clear to auscultation bilaterally, respirations unlabored  Chest Wall:    No tenderness  or deformity   Heart:    Regular rate and rhythm, S1 and S2 normal, no murmur, rub   or gallop  Breast Exam:    No tenderness, masses, or nipple abnormality  Abdomen:     Soft, non-tender, bowel sounds active all four quadrants,    no masses,  no organomegaly  Genitalia:    Normal female without lesion, discharge or tenderness, without cmt, no adnexal masses, uterus soft and midline  Extremities:   Extremities normal, atraumatic, no cyanosis or edema  Pulses:   2+ and symmetric all extremities  Skin:   Skin color, texture, turgor normal, no rashes or lesions  Lymph nodes:   Cervical, supraclavicular, and axillary nodes normal  Neurologic:   CNII-XII intact, normal strength, sensation and reflexes    throughout     Assessment:  Welcome To Medicare Exam     Plan:  During the course of the visit the patient was educated and counseled about appropriate screening and preventive services including:   Today patient had a Welcome to [employer] Visit as well as a problem visit today   Discussed the patient's BMI with her. The BMI BMI is in the acceptable range    Problem List Items Addressed This Visit        Welcome to Medicare preventive visit    -  Primary   Need for vaccination       Relevant Orders   Prevnar 3 (Completed)   Screening for HPV (human papillomavirus)       Relevant Orders   Cytology - PAP([place])   Anxiety       Relevant Medications   diazepam (VALIUM) 10 MG tablet   Ulcerative colitis without complications, unspecified location ([place])    -  Stable with valium, changed UC medication to mesalamine which is more affordable. Pt already researched side effects and took it about 10 years gao    Relevant Medications   diazepam (VALIUM) 10 MG tablet   Encounter for screening mammogram for malignant neoplasm of breast       Relevant Orders   MM Digital Screening   Screening for osteoporosis       Relevant Orders   DG Bone Density   Postmenopausal bleeding    - would advise follow up with Gynecology as it is important to rule out endometrial hyperplasia   Relevant Orders   Ambulatory referral to Obstetrics / Gynecology        Cardiovascular and Mediastinum   HTN (hypertension)  - bp in acceptable  range   Relevant Medications   lisinopril (ZESTRIL) 20 MG tablet   Other Relevant Orders   Lipid panel   CMET with GFR     Other   Estrogen deficiency   Relevant Orders   MM Digital Screening   DG Bone Density       The following orders were placed at today's visit;  Orders Placed This Encounter  Procedures  . MM Digital Screening  . DG Bone Density  . Prevnar 13  . Lipid panel  . CMET with GFR  . Ambulatory referral to Obstetrics / Gynecology     No follow-ups on file.  No future appointments.  Patient Instructions   We recommend that you schedule a mammogram for breast cancer screening. Typically, you do not need a referral to do this. Please contact a local imaging center to schedule your mammogram and bone density.   [place] [phone]    If  you have lab work done today you will be contacted with your lab results within the next 2 weeks.  If you have not heard from [place] then please contact us. The fastest way to get your results is to register for My Chart.   IF you received an x-ray today, you will receive an invoice from [facility]. Please contact [facility] at [phone] with questions or concerns regarding your invoice.   IF you received labwork today, you will receive an invoice from [facility]. Please contact [facility] at [phone] with questions or concerns regarding your invoice.   Our billing staff will not be able to assist you with questions regarding bills from these companies.  You will be contacted with the lab results as soon as they are available. The fastest way to get your results is to activate your My Chart account. Instructions are located on the last page of this paperwork. If you have not heard from [place] regarding the results in 2 weeks, please contact this office.        An after visit summary with all of these plans was given to the patient.

## 2019-01-15 NOTE — Patient Instructions (Addendum)
We recommend that you schedule a mammogram for breast cancer screening. Typically, you do not need a referral to do this. Please contact a local imaging center to schedule your mammogram and bone density.   [place] [phone]    If you have lab work done today you will be contacted with your lab results within the next 2 weeks.  If you have not heard from [place] then please contact us. The fastest way to get your results is to register for My Chart.   IF you received an x-ray today, you will receive an invoice from [facility]. Please contact [facility] at [phone] with questions or concerns regarding your invoice.   IF you received labwork today, you will receive an invoice from [place]. Please contact [facility] at [phone] with questions or concerns regarding your invoice.   Our billing staff will not be able to assist you with questions regarding bills from these companies.  You will be contacted with the lab results as soon as they are available. The fastest way to get your results is to activate your My Chart account. Instructions are located on the last page of this paperwork. If you have not heard from [place] regarding the results in 2 weeks, please contact this office.

## 2019-01-16 LAB — CMP14+EGFR
ALT: 22 IU/L (ref 0–32)
AST: 22 IU/L (ref 0–40)
Albumin/Globulin Ratio: 1.7 (ref 1.2–2.2)
Albumin: 4.3 g/dL (ref 3.8–4.8)
Alkaline Phosphatase: 93 IU/L (ref 39–117)
BUN/Creatinine Ratio: 22 (ref 12–28)
BUN: 14 mg/dL (ref 8–27)
Bilirubin Total: 0.3 mg/dL (ref 0.0–1.2)
CO2: 20 mmol/L (ref 20–29)
Calcium: 9.6 mg/dL (ref 8.7–10.3)
Chloride: 103 mmol/L (ref 96–106)
Creatinine, Ser: 0.65 mg/dL (ref 0.57–1.00)
GFR calc Af Amer: 108 mL/min/{1.73_m2} (ref >59)
GFR calc non Af Amer: 93 mL/min/{1.73_m2} (ref >59)
Globulin, Total: 2.6 g/dL (ref 1.5–4.5)
Glucose: 85 mg/dL (ref 65–99)
Potassium: 4.7 mmol/L (ref 3.5–5.2)
Sodium: 141 mmol/L (ref 134–144)
Total Protein: 6.9 g/dL (ref 6.0–8.5)

## 2019-01-16 LAB — LIPID PANEL
Chol/HDL Ratio: 4 ratio (ref 0.0–4.4)
Cholesterol, Total: 222 mg/dL — ABNORMAL HIGH (ref 100–199)
HDL: 56 mg/dL (ref >39)
LDL Chol Calc (NIH): 139 mg/dL — ABNORMAL HIGH (ref 0–99)
Triglycerides: 149 mg/dL (ref 0–149)
VLDL Cholesterol Cal: 27 mg/dL (ref 5–40)

## 2019-01-17 LAB — CYTOLOGY - PAP
Comment: NEGATIVE
Diagnosis: NEGATIVE
High risk HPV: NEGATIVE

## 2019-02-11 ENCOUNTER — Ambulatory Visit: Payer: Medicare Other | Admitting: Obstetrics and Gynecology

## 2019-02-11 ENCOUNTER — Encounter: Payer: Self-pay | Admitting: Obstetrics and Gynecology

## 2019-02-11 ENCOUNTER — Other Ambulatory Visit (HOSPITAL_COMMUNITY)
Admission: RE | Admit: 2019-02-11 | Discharge: 2019-02-11 | Disposition: A | Discharge status: Home / Self Care | Payer: Medicare Other | Source: Ambulatory Visit | Attending: Obstetrics and Gynecology | Admitting: Obstetrics and Gynecology

## 2019-02-11 ENCOUNTER — Other Ambulatory Visit: Payer: Self-pay

## 2019-02-11 VITALS — BP 153/88 | HR 65 | Ht 64.0 in | Wt 182.7 lb

## 2019-02-11 DIAGNOSIS — N898 Other specified noninflammatory disorders of vagina: Secondary | ICD-10-CM | POA: Diagnosis not present

## 2019-02-11 DIAGNOSIS — N950 Postmenopausal bleeding: Secondary | ICD-10-CM | POA: Diagnosis not present

## 2019-02-11 NOTE — Progress Notes (Addendum)
New GYN referred for Postmenopausal Bleeding. Her Urologist gave her samples of Premarin for vaginal Atrophy which she took for 3 weeks after which she started bleeding/spotting x 2.  Next Mammogram 02/12/2019 [facility]

## 2019-02-11 NOTE — Progress Notes (Signed)
GYNECOLOGY ANNUAL PREVENTATIVE CARE ENCOUNTER NOTE  Subjective:   Julie Lee is a 63 y.o. G93P1011 female here for a annual gynecologic exam. Current complaints: vaginal bleeding after using estrogen cream.  Went through menopause 8-10 years ago, has not had any bleeding since. Saw Urologist first as she has a history of kidney stones. Her Urologist game her some vaginal estrogen as he felt she was having some vaginal atrophy. She took about 3 weeks worth of the estrogen. After about 3 weeks, she had some bleeding on tissue when she wiped or on her panty liner. She stopped the estrogen cream when she ran out and saw her PCP for the same. PCP saw blood in vaginal vault during her pap smear, although patient had stopped taking cream.  She felt like she had more energy while taking the estrogen cream. Feels very low energy, states she "feels like someone has stuck a vacuum cleaner to her butt and sucked the life out of her." Feels pain, burning and discomfort in her groin area, particularly when she has been on her feet all day. She is not sexually active due to her vaginal pain. Denies hot flashes, night sweats. She has had these symptoms but overall, not bothersome to her. Had migraines throughout perimenopausal period.  Has stress urinary incontinence. Not overly bothersome to her at this point.   Gynecologic History No LMP recorded. Patient is postmenopausal. Contraception: post menopausal status Last Pap: 01/2019. Results were: negative - Has not had regular paps, last one before 11/20 was 5-6 years. Has never had abnormal pap. - H/o abnormal mammogram 4 years ago, had biopsy but no follow up after that. Has mammogram scheduled for tomorrow - Has Dexa done tomorrow, h/o abnormal > 5 years prior  Obstetric History OB History  Gravida Para Term Preterm AB Living  2 1 1   1 1   SAB TAB Ectopic Multiple Live Births               # Outcome Date GA Lbr Len/2nd Weight Sex Delivery Anes  PTL Lv  2 Term           1 AB             Past Medical History:  Diagnosis Date  . Allergy   . Anemia   . Anxiety   . Arthritis   . Colitis   . Hypertension   . Kidney stones   . UTI (lower urinary tract infection)     Past Surgical History:  Procedure Laterality Date  . CESAREAN SECTION    . COLONOSCOPY    . TUBAL LIGATION      Current Outpatient Medications on File Prior to Visit  Medication Sig Dispense Refill  . atenolol (TENORMIN) 50 MG tablet Take 1 tablet (50 mg total) by mouth daily. 90 tablet 1  . balsalazide (COLAZAL) 750 MG capsule Take 3 capsules (2,250 mg total) by mouth 3 (three) times daily. 270 capsule 3  . cyclobenzaprine (FLEXERIL) 10 MG tablet Take 1 tablet (10 mg total) by mouth 2 (two) times daily as needed for muscle spasms. 180 tablet 5  . diazepam (VALIUM) 10 MG tablet Take 1 tablet (10 mg total) by mouth every 12 (twelve) hours as needed. for anxiety 60 tablet 0  . gabapentin (NEURONTIN) 300 MG capsule Take 1 capsule (300 mg total) by mouth 3 (three) times daily. (Patient not taking: Reported on 02/11/2019) 90 capsule 5  . lisinopril (ZESTRIL) 20 MG tablet Take  1 tablet (20 mg total) by mouth daily. 90 tablet 1  . mesalamine (LIALDA) 1.2 g EC tablet Take 2 tablets (2.4 g total) by mouth daily with breakfast. 60 tablet 11  . Multiple Vitamins-Minerals (MULTIVITAMIN GUMMIES ADULT) CHEW Chew 1 tablet by mouth daily.    . traMADol (ULTRAM) 50 MG tablet TAKE 1 TO 2 TABLETS BY MOUTH EVERY 12 HOURS AS NEEDED 90 tablet 0   No current facility-administered medications on file prior to visit.     No Known Allergies  Social History   Socioeconomic History  . Marital status: Single    Spouse name: Not on file  . Number of children: Not on file  . Years of education: Not on file  . Highest education level: Not on file  Occupational History  . Occupation: selfemployed  Social Needs  . Financial resource strain: Not on file  . Food insecurity    Worry:  Not on file    Inability: Not on file  . Transportation needs    Medical: Not on file    Non-medical: Not on file  Tobacco Use  . Smoking status: Never Smoker  . Smokeless tobacco: Never Used  Substance and Sexual Activity  . Alcohol use: Yes    Comment: rarely  . Drug use: No  . Sexual activity: Not Currently    Birth control/protection: Abstinence  Lifestyle  . Physical activity    Days per week: Not on file    Minutes per session: Not on file  . Stress: Not on file  Relationships  . Social Herbalist on phone: Not on file    Gets together: Not on file    Attends religious service: Not on file    Active member of club or organization: Not on file    Attends meetings of clubs or organizations: Not on file    Relationship status: Not on file  . Intimate partner violence    Fear of current or ex partner: Not on file    Emotionally abused: Not on file    Physically abused: Not on file    Forced sexual activity: Not on file  Other Topics Concern  . Not on file  Social History Narrative   Lives alone   Good relationship with son and granddaughter    Family History  Problem Relation Age of Onset  . Heart disease Father   . Lung cancer Father   . Hyperlipidemia Mother   . Colitis Brother   . Drug abuse Brother   . Colon cancer Maternal Uncle 62  . Diabetes Paternal Uncle   . Diabetes Paternal Grandfather   . Heart disease Paternal Grandfather   . Kidney disease Paternal Uncle   . Heart disease Maternal Grandfather    The following portions of the patient's history were reviewed and updated as appropriate: allergies, current medications, past family history, past medical history, past social history, past surgical history and problem list.  Review of Systems Pertinent items are noted in HPI.   Objective:  BP (!) 153/88   Pulse 65   Ht 5' 4"  (1.626 m)   Wt 182 lb 11.2 oz (82.9 kg)   BMI 31.36 kg/m  CONSTITUTIONAL: Well-developed, well-nourished  female in no acute distress.  HENT:  Normocephalic, atraumatic, External right and left ear normal. Oropharynx is clear and moist EYES: Conjunctivae and EOM are normal. Pupils are equal, round, and reactive to light. No scleral icterus.  NECK: Normal range of motion, supple,  no masses.  Normal thyroid.  SKIN: Skin is warm and dry. No rash noted. Not diaphoretic. No erythema. No pallor. NEUROLOGIC: Alert and oriented to person, place, and time. Normal reflexes, muscle tone coordination. No cranial nerve deficit noted. PSYCHIATRIC: Normal mood and affect. Normal behavior. Normal judgment and thought content. CARDIOVASCULAR: Normal heart rate noted, regular rhythm RESPIRATORY: Clear to auscultation bilaterally. Effort and breath sounds normal, no problems with respiration noted. BREASTS: deferred ABDOMEN: Soft, normal bowel sounds, no distention noted.  No tenderness, rebound or guarding.  PELVIC: atrophic but otherwise normal appearing external genitalia; normal appearing vaginal mucosa and cervix.  No abnormal discharge noted.  Pap smear obtained.  Normal uterine size, no other palpable masses, no uterine or adnexal tenderness. MUSCULOSKELETAL: Normal range of motion. No tenderness.  No cyanosis, clubbing, or edema.  2+ distal pulses.  Exam done with chaperone present.   Assessment and Plan:   1. Post-menopausal bleeding - Reviewed likelihood that bleeding is secondary to manipulation from putting applicator in the vagina, lower likelihood that it is secondary to uterine bleeding related to estrogen - reviewed low risk of malignancy with thin endometrial stripe, recommended TVUS to rule out thickened stripe and will base further management on that, EMB if thickened - she is agreeable to plan - [place] G[facility] Pelvis Complete with Transvaginal; Future - Cervicovaginal ancillary only( [place])  2. Vaginal irritation Wet prep   Will follow up results of pap smear and manage accordingly.  Encouraged improvement in diet and exercise.  Mammogram UTD Referral for colonoscopy n/a bc UTD Flu UTD DEXA not due, done 2015  Routine preventative health maintenance measures emphasized. Please refer to After Visit Summary for other counseling recommendations.   Total face-to-face time with patient: 30 minutes. Over 50% of encounter was spent on counseling and coordination of care.   Feliz Beam, M.D. Attending Center for [employer] Fish farm manager)

## 2019-02-12 DIAGNOSIS — K5190 Ulcerative colitis, unspecified, without complications: Secondary | ICD-10-CM | POA: Diagnosis not present

## 2019-02-12 DIAGNOSIS — M8589 Other specified disorders of bone density and structure, multiple sites: Secondary | ICD-10-CM | POA: Diagnosis not present

## 2019-02-12 DIAGNOSIS — Z1231 Encounter for screening mammogram for malignant neoplasm of breast: Secondary | ICD-10-CM | POA: Diagnosis not present

## 2019-02-12 DIAGNOSIS — R29890 Loss of height: Secondary | ICD-10-CM | POA: Diagnosis not present

## 2019-02-12 LAB — HM DEXA SCAN

## 2019-02-12 LAB — HM MAMMOGRAPHY

## 2019-02-13 LAB — CERVICOVAGINAL ANCILLARY ONLY
Bacterial Vaginitis (gardnerella): NEGATIVE
Candida Glabrata: NEGATIVE
Candida Vaginitis: NEGATIVE
Comment: NEGATIVE
Comment: NEGATIVE
Comment: NEGATIVE

## 2019-02-18 ENCOUNTER — Telehealth: Payer: Self-pay | Admitting: Family Medicine

## 2019-02-18 NOTE — Telephone Encounter (Signed)
Patient notified of osteopenia on Bone Density.

## 2019-02-22 ENCOUNTER — Encounter: Payer: Self-pay | Admitting: *Deleted

## 2019-02-26 ENCOUNTER — Other Ambulatory Visit: Payer: Self-pay

## 2019-02-26 ENCOUNTER — Ambulatory Visit (HOSPITAL_COMMUNITY)
Admission: RE | Admit: 2019-02-26 | Discharge: 2019-02-26 | Disposition: A | Discharge status: Home / Self Care | Payer: Medicare Other | Source: Ambulatory Visit | Attending: Obstetrics and Gynecology | Admitting: Obstetrics and Gynecology

## 2019-02-26 DIAGNOSIS — N950 Postmenopausal bleeding: Secondary | ICD-10-CM | POA: Diagnosis not present

## 2019-02-27 ENCOUNTER — Telehealth: Payer: Self-pay

## 2019-02-27 NOTE — Telephone Encounter (Signed)
Attempted to reach patient regarding results and need for appt pt was not ava left detailed message for pt to contact the office.

## 2019-02-27 NOTE — Telephone Encounter (Signed)
-----   Message from Sloan Leiter, MD sent at 02/27/2019 12:30 PM EST ----- Pt needs office endometrial biopsy for thickened endometrial stripe ASAP please. Please call and let her know. Thanks.

## 2019-03-11 ENCOUNTER — Other Ambulatory Visit: Payer: Self-pay | Admitting: Family Medicine

## 2019-03-11 NOTE — Telephone Encounter (Signed)
Requested medication (s) are due for refill today: yes  Requested medication (s) are on the active medication list:  yes  Last refill:  01/15/2019  Future visit scheduled: no  Notes to clinic:  This refill cannot be delegated    Requested Prescriptions  Pending Prescriptions Disp Refills   traMADol (ULTRAM) 50 MG tablet [Pharmacy Med Name: traMADol HCl 50 MG Oral Tablet] 90 tablet 0    Sig: TAKE 1 TO 2 TABLETS BY MOUTH EVERY 12 HOURS AS NEEDED      Not Delegated - Analgesics:  Opioid Agonists Failed - 03/11/2019  2:05 PM      Failed - This refill cannot be delegated      Failed - Urine Drug Screen completed in last 360 days.      Passed - Valid encounter within last 6 months    Recent Outpatient Visits           1 month ago Welcome to [facility] preventive visit   Primary Care at [facility], Arlie Solomons, MD   6 months ago Estrogen deficiency   Primary Care at [place], MD   11 months ago Essential hypertension   Primary Care at [facility], Arlie Solomons, MD   1 year ago Osteopenia of multiple sites   [facility] at [facility], Damaris Hippo, PA-C   1 year ago Essential hypertension   [facility] at [facility], Damaris Hippo, PA-C

## 2019-03-14 ENCOUNTER — Other Ambulatory Visit (HOSPITAL_COMMUNITY)
Admission: RE | Admit: 2019-03-14 | Discharge: 2019-03-14 | Disposition: A | Discharge status: Home / Self Care | Payer: Medicare Other | Source: Ambulatory Visit | Attending: Obstetrics and Gynecology | Admitting: Obstetrics and Gynecology

## 2019-03-14 ENCOUNTER — Other Ambulatory Visit: Payer: Self-pay

## 2019-03-14 ENCOUNTER — Encounter: Payer: Self-pay | Admitting: Obstetrics and Gynecology

## 2019-03-14 ENCOUNTER — Ambulatory Visit: Payer: Medicare Other | Admitting: Obstetrics and Gynecology

## 2019-03-14 VITALS — BP 119/78 | HR 60 | Wt 179.8 lb

## 2019-03-14 DIAGNOSIS — N950 Postmenopausal bleeding: Secondary | ICD-10-CM

## 2019-03-14 DIAGNOSIS — R9389 Abnormal findings on diagnostic imaging of other specified body structures: Secondary | ICD-10-CM

## 2019-03-14 NOTE — Progress Notes (Signed)
Pt is here for follow up from [place] on 02/26/19. EMB recommended.

## 2019-03-14 NOTE — Addendum Note (Signed)
Addended by: Vivien Rota on: 03/14/2019 01:11 PM   Modules accepted: Orders

## 2019-03-14 NOTE — Progress Notes (Signed)
ENDOMETRIAL BIOPSY      Julie Lee is a 64 y.o. G2P1011 here for endometrial biopsy.  The indications for endometrial biopsy were reviewed.  Risks of the biopsy including cramping, bleeding, infection, uterine perforation, inadequate specimen and need for additional procedures were discussed. The patient states she understands and agrees to undergo procedure today. Consent was signed. Time out was performed.   Indications: post menopausal bleeding, thickened endometrial stripe Urine HCG: n/a  A bivalve speculum was placed into the vagina and the cervix was easily visualized and was prepped with Betadine x2. A single-toothed tenaculum was placed on the anterior lip of the cervix to stabilize it. The 3 mm pipelle was introduced into the endometrial cavity without difficulty to a depth of 7 cm, and a moderate amount of tissue was obtained and sent to pathology. This was repeated for a total of 3 passes. The instruments were removed from the patient's vagina. Minimal bleeding from the cervix at the tenaculum was noted.   The patient tolerated the procedure well. Routine post-procedure instructions were given to the patient.    Will base further management on results of biopsy.  Feliz Beam, M.D. Attending Center for [employer] Fish farm manager)

## 2019-03-18 ENCOUNTER — Telehealth: Payer: Self-pay

## 2019-03-18 LAB — SURGICAL PATHOLOGY

## 2019-03-18 NOTE — Telephone Encounter (Signed)
Patient would like a call regarding results of biopsy last week.

## 2019-03-19 ENCOUNTER — Telehealth: Payer: Self-pay

## 2019-03-19 NOTE — Telephone Encounter (Signed)
Patient is calling to find out results of her biopsy she had done last week. I advised pt that I will reach out to the provider.

## 2019-03-26 ENCOUNTER — Ambulatory Visit: Payer: Medicare Other | Attending: Internal Medicine

## 2019-03-26 DIAGNOSIS — Z23 Encounter for immunization: Secondary | ICD-10-CM | POA: Insufficient documentation

## 2019-03-26 NOTE — Progress Notes (Signed)
   Covid-19 Vaccination Clinic  Name:  Julie Lee    MRN: [id number] DOB: [birth year]  03/26/2019  [name] was observed post Covid-19 immunization for 15 minutes without incidence. She was provided with Vaccine Information Sheet and instruction to access the V-Safe system.   [name] was instructed to call 911 with any severe reactions post vaccine: Marland Kitchen Difficulty breathing  . Swelling of your face and throat  . A fast heartbeat  . A bad rash all over your body  . Dizziness and weakness    Immunizations Administered    Name Date Dose VIS Date Route   Pfizer COVID-19 Vaccine 03/26/2019 12:06 PM 0.3 mL 02/15/2019 Intramuscular   Manufacturer: [place]   Lot: [id number]   [place]: [address]-4

## 2019-04-16 ENCOUNTER — Ambulatory Visit: Payer: Medicare Other | Attending: Internal Medicine

## 2019-04-16 DIAGNOSIS — Z23 Encounter for immunization: Secondary | ICD-10-CM | POA: Insufficient documentation

## 2019-04-16 NOTE — Progress Notes (Signed)
   Covid-19 Vaccination Clinic  Name:  Julie Lee    MRN: [id number] DOB: 19-Aug-1955  04/16/2019  Julie Lee was observed post Covid-19 immunization for 15 minutes without incidence. She was provided with Vaccine Information Sheet and instruction to access the V-Safe system.   Julie Lee was instructed to call 911 with any severe reactions post vaccine: Marland Kitchen Difficulty breathing  . Swelling of your face and throat  . A fast heartbeat  . A bad rash all over your body  . Dizziness and weakness    Immunizations Administered    Name Date Dose VIS Date Route   Pfizer COVID-19 Vaccine 04/16/2019 12:42 PM 0.3 mL 02/15/2019 Intramuscular   Manufacturer: [place]   Lot: [id number]   [place]: [address]-2

## 2019-04-28 ENCOUNTER — Other Ambulatory Visit: Payer: Self-pay | Admitting: Family Medicine

## 2019-04-28 NOTE — Telephone Encounter (Signed)
Requested medication (s) are due for refill today: {yes  Requested medication (s) are on the active medication list: yes  Last refill: 03/11/19   #90  0 refills  Future visit scheduled no  Notes to clinic:   not delegated  Requested Prescriptions  Pending Prescriptions Disp Refills   traMADol (ULTRAM) 50 MG tablet [Pharmacy Med Name: traMADol HCl 50 MG Oral Tablet] 90 tablet 0    Sig: TAKE 1 TO 2 TABLETS BY MOUTH EVERY 12 HOURS AS NEEDED      Not Delegated - Analgesics:  Opioid Agonists Failed - 04/28/2019  3:23 PM      Failed - This refill cannot be delegated      Failed - Urine Drug Screen completed in last 360 days.      Passed - Valid encounter within last 6 months    Recent Outpatient Visits           3 months ago Welcome to [employer] preventive visit   Primary Care at [place], MD   8 months ago Estrogen deficiency   Primary Care at [facility], Arlie Solomons, MD   1 year ago Essential hypertension   Primary Care at [facility], Arlie Solomons, MD   1 year ago Osteopenia of multiple sites   [facility] at [facility], Damaris Hippo, PA-C   1 year ago Essential hypertension   [facility] at [facility], Damaris Hippo, PA-C

## 2019-05-01 NOTE — Telephone Encounter (Signed)
Patient would like her medication request to be expedited. Patient out of medication.

## 2019-05-01 NOTE — Telephone Encounter (Signed)
Patient is requesting a refill of the following medications: Requested Prescriptions   Pending Prescriptions Disp Refills  . traMADol (ULTRAM) 50 MG tablet [Pharmacy Med Name: traMADol HCl 50 MG Oral Tablet] 90 tablet 0    Sig: TAKE 1 TO 2 TABLETS BY MOUTH EVERY 12 HOURS AS NEEDED    Date of patient request: 04/28/19 Last office visit: 01/15/19 Date of last refill:03/11/2019 Last refill amount: 90 + 0 (sig is to talk 1-2 tabs PRN) Follow up time period per chart: no follow up scheduled.

## 2019-05-15 ENCOUNTER — Telehealth: Payer: Self-pay | Admitting: Obstetrics

## 2019-05-15 ENCOUNTER — Other Ambulatory Visit: Payer: Self-pay | Admitting: Obstetrics

## 2019-05-15 DIAGNOSIS — R9389 Abnormal findings on diagnostic imaging of other specified body structures: Secondary | ICD-10-CM

## 2019-05-15 MED ORDER — MEGESTROL ACETATE 40 MG PO TABS: 40.0000 mg | ORAL_TABLET | Freq: Two times a day (BID) | ORAL | 0 refills | Status: DC

## 2019-05-15 MED ORDER — MEGESTROL ACETATE 40 MG PO TABS: 40.0000 mg | ORAL_TABLET | Freq: Every day | ORAL | 0 refills | Status: DC

## 2019-05-15 NOTE — Telephone Encounter (Signed)
Called patient to discuss results of ultrasound and endometrial biopsy.  Megace was Rx for a thickened endometrium on ultrasound.  All questions answered to patients satisfaction.  A/P:  Postmenopausal bleeding with thickened heterogenous endometrium on ultrasound.  Megace Rx.  F/U in 6 weeks.  She will probably need Hysteroscopy / D&C.  Shelly Bombard, MD 05/15/2019 11:22 AM

## 2019-06-10 ENCOUNTER — Other Ambulatory Visit: Payer: Self-pay | Admitting: Family Medicine

## 2019-06-10 NOTE — Telephone Encounter (Signed)
Patient is requesting a refill of the following medications: Requested Prescriptions   Pending Prescriptions Disp Refills  . traMADol (ULTRAM) 50 MG tablet [Pharmacy Med Name: traMADol HCl 50 MG Oral Tablet] 90 tablet 0    Sig: TAKE 1 TO 2 TABLETS BY MOUTH EVERY 12 HOURS AS NEEDED    Date of patient request: 06/10/2019 Last office visit: 01/15/2019 Date of last refill: 05/02/2019 Last refill amount: 90 tablets  Follow up time period per chart: No appointment scheduled

## 2019-06-10 NOTE — Telephone Encounter (Signed)
Requested medication (s) are due for refill today:yes  Requested medication (s) are on the active medication list: yes  Last refill:  05/02/2019   #90  0 refills  Future visit scheduled no  Notes to clinic: Not delegated  Requested Prescriptions  Pending Prescriptions Disp Refills   traMADol (ULTRAM) 50 MG tablet [Pharmacy Med Name: traMADol HCl 50 MG Oral Tablet] 90 tablet 0    Sig: TAKE 1 TO 2 TABLETS BY MOUTH EVERY 12 HOURS AS NEEDED      Not Delegated - Analgesics:  Opioid Agonists Failed - 06/10/2019  9:22 AM      Failed - This refill cannot be delegated      Failed - Urine Drug Screen completed in last 360 days.      Passed - Valid encounter within last 6 months    Recent Outpatient Visits           4 months ago Welcome to [employer] preventive visit   Primary Care at [facility], Arlie Solomons, MD   9 months ago Estrogen deficiency   Primary Care at [facility], Arlie Solomons, MD   1 year ago Essential hypertension   Primary Care at [facility], Arlie Solomons, MD   1 year ago Osteopenia of multiple sites   [facility] at [facility], Damaris Hippo, PA-C   1 year ago Essential hypertension   Primary Care at [place], PA-C       Future Appointments             In 2 weeks Constant, Peggy, MD [place]

## 2019-06-20 ENCOUNTER — Encounter: Payer: Self-pay | Admitting: Gastroenterology

## 2019-06-20 ENCOUNTER — Ambulatory Visit: Payer: Medicare Other | Admitting: Gastroenterology

## 2019-06-20 ENCOUNTER — Other Ambulatory Visit (INDEPENDENT_AMBULATORY_CARE_PROVIDER_SITE_OTHER): Payer: Medicare Other

## 2019-06-20 VITALS — BP 130/80 | HR 65 | Temp 98.7°F | Ht 63.5 in | Wt 175.0 lb

## 2019-06-20 DIAGNOSIS — Z8719 Personal history of other diseases of the digestive system: Secondary | ICD-10-CM

## 2019-06-20 DIAGNOSIS — K625 Hemorrhage of anus and rectum: Secondary | ICD-10-CM

## 2019-06-20 DIAGNOSIS — R1031 Right lower quadrant pain: Secondary | ICD-10-CM

## 2019-06-20 LAB — CBC WITH DIFFERENTIAL/PLATELET
Basophils Absolute: 0 10*3/uL (ref 0.0–0.1)
Basophils Relative: 0.5 % (ref 0.0–3.0)
Eosinophils Absolute: 0.2 10*3/uL (ref 0.0–0.7)
Eosinophils Relative: 3.6 % (ref 0.0–5.0)
HCT: 37.8 % (ref 36.0–46.0)
Hemoglobin: 12.5 g/dL (ref 12.0–15.0)
Lymphocytes Relative: 39.7 % (ref 12.0–46.0)
Lymphs Abs: 2.7 10*3/uL (ref 0.7–4.0)
MCHC: 33 g/dL (ref 30.0–36.0)
MCV: 86.9 fl (ref 78.0–100.0)
Monocytes Absolute: 0.5 10*3/uL (ref 0.1–1.0)
Monocytes Relative: 7 % (ref 3.0–12.0)
Neutro Abs: 3.3 10*3/uL (ref 1.4–7.7)
Neutrophils Relative %: 49.2 % (ref 43.0–77.0)
Platelets: 282 10*3/uL (ref 150.0–400.0)
RBC: 4.35 Mil/uL (ref 3.87–5.11)
RDW: 13.3 % (ref 11.5–15.5)
WBC: 6.8 10*3/uL (ref 4.0–10.5)

## 2019-06-20 LAB — C-REACTIVE PROTEIN: CRP: <1 mg/dL (ref 0.5–20.0)

## 2019-06-20 LAB — SEDIMENTATION RATE: Sed Rate: 34 mm/hr — ABNORMAL HIGH (ref 0–30)

## 2019-06-20 MED ORDER — NA SULFATE-K SULFATE-MG SULF 17.5-3.13-1.6 GM/177ML PO SOLN: 1.0000 | Freq: Once | ORAL | 0 refills | Status: AC

## 2019-06-20 MED ORDER — BALSALAZIDE DISODIUM 750 MG PO CAPS: 2250.0000 mg | ORAL_CAPSULE | Freq: Three times a day (TID) | ORAL | 3 refills | Status: DC

## 2019-06-20 NOTE — Patient Instructions (Addendum)
If you are age 66 or older, your body mass index should be between 23-30. Your Body mass index is 30.51 kg/m. If this is out of the aforementioned range listed, please consider follow up with your Primary Care Provider.  If you are age 66 or younger, your body mass index should be between 19-25. Your Body mass index is 30.51 kg/m. If this is out of the aformentioned range listed, please consider follow up with your Primary Care Provider.   Your provider has requested that you go to the basement level for lab work before leaving today. Press "B" on the elevator. The lab is located at the first door on the left as you exit the elevator.  We have sent the following medications to your pharmacy for you to pick up at your convenience:  Colazal 750 mg - 3 tablets three times daily.

## 2019-06-20 NOTE — Progress Notes (Signed)
06/20/2019 Julie Lee [id number] April 04, 1955   HISTORY OF PRESENT ILLNESS: This is a 64 year old female who was seen by Dr. Ardis Hughs in April 2015 for the first and only time.  She then had both EGD and colonoscopy in June 2015.  Colonoscopy revealed erythema in a segment of the ascending colon.  This biopsy showed mild active chronic colitis.  Biopsies in other areas of the colon showed benign mucosa without inflammation or other changes.  She has a history of some type of colitis.  Has been on colazol that has been prescribed as 750 mg, 3 tablets 3 times daily.  She admits that she is not consistent with it.  Usually only takes it once or twice daily unless she is having a flare of symptoms.  We have not seen her since 2015.  She gets her colazol prescription from her PCP typically.  She is here today with complaints of right lower quadrant abdominal pain and rectal bleeding.  She says that her bowel movements are normal, 1 or 2 solid/formed bowel movements daily.  She has been seeing bright red blood with bowel movements consistently for a few weeks.  Says that RLQ abdominal pain is typical of her flares.  She tells me that previously she was on Lialda and that caused her to have hair loss.    Past Medical History:  Diagnosis Date  . Allergy   . Anemia   . Anxiety   . Arthritis   . Colitis   . Hypertension   . Kidney stones   . UTI (lower urinary tract infection)    Past Surgical History:  Procedure Laterality Date  . CESAREAN SECTION    . COLONOSCOPY    . TUBAL LIGATION      reports that she has never smoked. She has never used smokeless tobacco. She reports current alcohol use. She reports that she does not use drugs. family history includes Colitis in her brother; Colon cancer (age of onset: 56) in her maternal uncle; Diabetes in her paternal grandfather and paternal uncle; Drug abuse in her brother; Heart disease in her father, maternal grandfather, and paternal grandfather;  Hyperlipidemia in her mother; Kidney disease in her paternal uncle; Lung cancer in her father. No Known Allergies    Outpatient Encounter Medications as of 06/20/2019  Medication Sig  . atenolol (TENORMIN) 50 MG tablet Take 1 tablet (50 mg total) by mouth daily.  . balsalazide (COLAZAL) 750 MG capsule Take 3 capsules (2,250 mg total) by mouth 3 (three) times daily.  . cyclobenzaprine (FLEXERIL) 10 MG tablet Take 1 tablet (10 mg total) by mouth 2 (two) times daily as needed for muscle spasms.  . diazepam (VALIUM) 10 MG tablet Take 1 tablet (10 mg total) by mouth every 12 (twelve) hours as needed. for anxiety  . lisinopril (ZESTRIL) 20 MG tablet Take 1 tablet (20 mg total) by mouth daily.  . megestrol (MEGACE) 40 MG tablet Take 1 tablet (40 mg total) by mouth 2 (two) times daily.  . Multiple Vitamins-Minerals (MULTIVITAMIN GUMMIES ADULT) CHEW Chew 1 tablet by mouth daily.  . traMADol (ULTRAM) 50 MG tablet TAKE 1 TO 2 TABLETS BY MOUTH EVERY 12 HOURS AS NEEDED  . [DISCONTINUED] gabapentin (NEURONTIN) 300 MG capsule Take 1 capsule (300 mg total) by mouth 3 (three) times daily. (Patient not taking: Reported on 02/11/2019)  . [DISCONTINUED] mesalamine (LIALDA) 1.2 g EC tablet Take 2 tablets (2.4 g total) by mouth daily with breakfast. (Patient not taking:  Reported on 03/14/2019)   No facility-administered encounter medications on file as of 06/20/2019.     REVIEW OF SYSTEMS  : All other systems reviewed and negative except where noted in the History of Present Illness.   PHYSICAL EXAM: BP 130/80   Pulse 65   Temp 98.7 F (37.1 C)   Ht 5' 3.5" (1.613 m)   Wt 175 lb (79.4 kg)   BMI 30.51 kg/m  General: Well developed white female in no acute distress Head: Normocephalic and atraumatic Eyes:  Sclerae anicteric, conjunctiva pink. Ears: Normal auditory acuity Lungs: Clear throughout to auscultation; no increased WOB. Heart: Regular rate and rhythm Abdomen: Soft, non-distended.  BS present.   Non-tender. Musculoskeletal: Symmetrical with no gross deformities  Skin: No lesions on visible extremities Extremities: No edema  Neurological: Alert oriented x 4, grossly non-focal Psychological:  Alert and cooperative. Normal mood and affect  ASSESSMENT AND PLAN: *64 year old female with history of some type of colitis, ?  Crohn's colitis as last colonoscopy with erythema and biopsies showed mild active chronic colitis in the ascending colon only.  Has been on colazol, but does not take full dose consistently.  Not seen here since 2015.  Now with complaints of rectal bleeding and right lower quadrant abdominal pain.  Will check CBC, CRP, sed rate, fecal calprotectin.  We will plan for colonoscopy with Dr. Ardis Hughs.  I have asked her to begin taking her colazol as directed with 750 mg tablets 3 pills 3 times daily.  Pending her response and results of labs may need prednisone, but I think we can avoid that for now.  New prescription was sent to her pharmacy.  The risks, benefits, and alternatives to colonoscopy were discussed with the patient and she consents to proceed.   CC:  Forrest Moron, MD

## 2019-06-25 ENCOUNTER — Other Ambulatory Visit: Payer: Self-pay

## 2019-06-25 ENCOUNTER — Telehealth (INDEPENDENT_AMBULATORY_CARE_PROVIDER_SITE_OTHER): Payer: Medicare Other | Admitting: Obstetrics and Gynecology

## 2019-06-25 ENCOUNTER — Encounter: Payer: Self-pay | Admitting: Obstetrics and Gynecology

## 2019-06-25 DIAGNOSIS — R9389 Abnormal findings on diagnostic imaging of other specified body structures: Secondary | ICD-10-CM

## 2019-06-25 MED ORDER — MEGESTROL ACETATE 40 MG PO TABS: 40.0000 mg | ORAL_TABLET | Freq: Two times a day (BID) | ORAL | 0 refills | Status: DC

## 2019-06-25 NOTE — Progress Notes (Signed)
GYNECOLOGY VIRTUAL VISIT ENCOUNTER NOTE  Provider location: [facility]   I connected with Julie Lee on 06/25/19 at  1:00 PM EDT by MyChart Video Encounter at home and verified that I am speaking with the correct person using two identifiers.   I discussed the limitations, risks, security and privacy concerns of performing an evaluation and management service virtually and the availability of in person appointments. I also discussed with the patient that there may be a patient responsible charge related to this service. The patient expressed understanding and agreed to proceed.   History:  Julie Lee is a 64 y.o. G49P1011 female being evaluated today for follow up on thickened endometrium. Patient reports cessation of vaginal bleeding with Megace recently. Patient describes the bleeding at bright red. She also reports onset of vaginal bleeding coinciding with onset of rectal bleeding due to colitis flare. She is scheduled for a colonoscopy to rule out a fistula. She denies any pelvic pain or other concerns.       Past Medical History:  Diagnosis Date  . Allergy   . Anemia   . Anxiety   . Arthritis   . Colitis   . Hypertension   . Kidney stones   . UTI (lower urinary tract infection)    Past Surgical History:  Procedure Laterality Date  . CESAREAN SECTION    . COLONOSCOPY    . TUBAL LIGATION     The following portions of the patient's history were reviewed and updated as appropriate: allergies, current medications, past family history, past medical history, past social history, past surgical history and problem list.   Health Maintenance:  Normal pap and negative HRHPV on 01/2019.  Normal mammogram on 02/2019.   Review of Systems:  Pertinent items noted in HPI and remainder of comprehensive ROS otherwise negative.  Physical Exam:   General:  Alert, oriented and cooperative. Patient appears to be in no acute distress.  Mental Status:  Normal mood and affect. Normal behavior. Normal judgment and thought content.   Respiratory: Normal respiratory effort, no problems with respiration noted  Rest of physical exam deferred due to type of encounter  Labs and Imaging Results for orders placed or performed in visit on 06/20/19 (from the past 336 hour(s))  C-reactive protein   Collection Time: 06/20/19  3:12 PM  Result Value Ref Range   CRP <1.0 0.5 - 20.0 mg/dL  Sed Rate (ESR)   Collection Time: 06/20/19  3:12 PM  Result Value Ref Range   Sed Rate 34 (H) 0 - 30 mm/hr  CBC with Differential/Platelet   Collection Time: 06/20/19  3:12 PM  Result Value Ref Range   WBC 6.8 4.0 - 10.5 K/uL   RBC 4.35 3.87 - 5.11 Mil/uL   Hemoglobin 12.5 12.0 - 15.0 g/dL   HCT 37.8 36.0 - 46.0 %   MCV 86.9 78.0 - 100.0 fl   MCHC 33.0 30.0 - 36.0 g/dL   RDW 13.3 11.5 - 15.5 %   Platelets 282.0 150.0 - 400.0 K/uL   Neutrophils Relative % 49.2 43.0 - 77.0 %   Lymphocytes Relative 39.7 12.0 - 46.0 %   Monocytes Relative 7.0 3.0 - 12.0 %   Eosinophils Relative 3.6 0.0 - 5.0 %   Basophils Relative 0.5 0.0 - 3.0 %   Neutro Abs 3.3 1.4 - 7.7 K/uL   Lymphs Abs 2.7 0.7 - 4.0 K/uL   Monocytes Absolute 0.5 0.1 - 1.0 K/uL  Eosinophils Absolute 0.2 0.0 - 0.7 K/uL   Basophils Absolute 0.0 0.0 - 0.1 K/uL   No results found.    03/2019 endometrial biopsy negative for malignancy   Assessment and Plan:     1. Thickened endometrium Continue Megace Follow up with colonoscopy Follow up ultrasound scheduled in 1 month RTC post ultrasound to discuss results and further interventions - US PELVIC COMPLETE WITH TRANSVAGINAL; Future - megestrol (MEGACE) 40 MG tablet; Take 1 tablet (40 mg total) by mouth 2 (two) times daily.  Dispense: 60 tablet; Refill: 0       I discussed the assessment and treatment plan with the patient. The patient was provided an opportunity to ask questions and all were answered. The patient agreed with the plan and demonstrated an  understanding of the instructions.   The patient was advised to call back or seek an in-person evaluation/go to the ED if the symptoms worsen or if the condition fails to improve as anticipated.  I provided 15 minutes of face-to-face time during this encounter.   Mora Bellman, MD [facility]

## 2019-06-27 ENCOUNTER — Encounter: Payer: Self-pay | Admitting: Gastroenterology

## 2019-06-27 DIAGNOSIS — Z8719 Personal history of other diseases of the digestive system: Secondary | ICD-10-CM | POA: Insufficient documentation

## 2019-06-27 DIAGNOSIS — K625 Hemorrhage of anus and rectum: Secondary | ICD-10-CM | POA: Insufficient documentation

## 2019-06-27 DIAGNOSIS — R1031 Right lower quadrant pain: Secondary | ICD-10-CM | POA: Insufficient documentation

## 2019-07-01 NOTE — Progress Notes (Signed)
I agree with the above note, plan 

## 2019-07-05 ENCOUNTER — Ambulatory Visit (AMBULATORY_SURGERY_CENTER): Payer: Medicare Other | Admitting: Gastroenterology

## 2019-07-05 ENCOUNTER — Encounter: Payer: Self-pay | Admitting: Gastroenterology

## 2019-07-05 ENCOUNTER — Other Ambulatory Visit: Payer: Self-pay

## 2019-07-05 VITALS — BP 104/80 | HR 66 | Temp 97.5°F | Resp 10 | Ht 63.0 in | Wt 175.0 lb

## 2019-07-05 DIAGNOSIS — K529 Noninfective gastroenteritis and colitis, unspecified: Secondary | ICD-10-CM | POA: Diagnosis not present

## 2019-07-05 DIAGNOSIS — Z8719 Personal history of other diseases of the digestive system: Secondary | ICD-10-CM

## 2019-07-05 DIAGNOSIS — K635 Polyp of colon: Secondary | ICD-10-CM

## 2019-07-05 DIAGNOSIS — D120 Benign neoplasm of cecum: Secondary | ICD-10-CM

## 2019-07-05 DIAGNOSIS — K625 Hemorrhage of anus and rectum: Secondary | ICD-10-CM | POA: Diagnosis not present

## 2019-07-05 DIAGNOSIS — K37 Unspecified appendicitis: Secondary | ICD-10-CM | POA: Diagnosis not present

## 2019-07-05 DIAGNOSIS — I10 Essential (primary) hypertension: Secondary | ICD-10-CM | POA: Diagnosis not present

## 2019-07-05 DIAGNOSIS — R109 Unspecified abdominal pain: Secondary | ICD-10-CM | POA: Diagnosis not present

## 2019-07-05 DIAGNOSIS — K5989 Other specified functional intestinal disorders: Secondary | ICD-10-CM | POA: Diagnosis not present

## 2019-07-05 DIAGNOSIS — F419 Anxiety disorder, unspecified: Secondary | ICD-10-CM | POA: Diagnosis not present

## 2019-07-05 HISTORY — PX: COLONOSCOPY: SHX174

## 2019-07-05 MED ORDER — SODIUM CHLORIDE 0.9 % IV SOLN: 500.0000 mL | Freq: Once | INTRAVENOUS | Status: DC

## 2019-07-05 NOTE — Patient Instructions (Signed)
YOU HAD AN ENDOSCOPIC PROCEDURE TODAY AT [facility]:   Refer to the procedure report that was given to you for any specific questions about what was found during the examination.  If the procedure report does not answer your questions, please call your gastroenterologist to clarify.  If you requested that your care partner not be given the details of your procedure findings, then the procedure report has been included in a sealed envelope for you to review at your convenience later.  YOU SHOULD EXPECT: Some feelings of bloating in the abdomen. Passage of more gas than usual.  Walking can help get rid of the air that was put into your GI tract during the procedure and reduce the bloating. If you had a lower endoscopy (such as a colonoscopy or flexible sigmoidoscopy) you may notice spotting of blood in your stool or on the toilet paper. If you underwent a bowel prep for your procedure, you may not have a normal bowel movement for a few days.  Please Note:  You might notice some irritation and congestion in your nose or some drainage.  This is from the oxygen used during your procedure.  There is no need for concern and it should clear up in a day or so.  SYMPTOMS TO REPORT IMMEDIATELY:   Following lower endoscopy (colonoscopy or flexible sigmoidoscopy):  Excessive amounts of blood in the stool  Significant tenderness or worsening of abdominal pains  Swelling of the abdomen that is new, acute  Fever of 100F or higher   For urgent or emergent issues, a gastroenterologist can be reached at any hour by calling [phone]. Do not use MyChart messaging for urgent concerns.    DIET:  We do recommend a small meal at first, but then you may proceed to your regular diet.  Drink plenty of fluids but you should avoid alcoholic beverages for 24 hours.  MEDICATIONS: Continue present medications.  FOLLOW UP: Follow up with Dr. Ardis Hughs in his office in 6-8 weeks.  Please see handouts  given to you by your recovery nurse.  ACTIVITY:  You should plan to take it easy for the rest of today and you should NOT DRIVE or use heavy machinery until tomorrow (because of the sedation medicines used during the test).    FOLLOW UP: Our staff will call the number listed on your records 48-72 hours following your procedure to check on you and address any questions or concerns that you may have regarding the information given to you following your procedure. If we do not reach you, we will leave a message.  We will attempt to reach you two times.  During this call, we will ask if you have developed any symptoms of COVID 19. If you develop any symptoms (ie: fever, flu-like symptoms, shortness of breath, cough etc.) before then, please call [phone].  If you test positive for Covid 19 in the 2 weeks post procedure, please call and report this information to [place].    If any biopsies were taken you will be contacted by phone or by letter within the next 1-3 weeks.  Please call us at [phone] if you have not heard about the biopsies in 3 weeks.   Thank you for allowing [place] to provide for your healthcare needs today.   SIGNATURES/CONFIDENTIALITY: You and/or your care partner have signed paperwork which will be entered into your electronic medical record.  These signatures attest to the fact that that the information above on your  After Visit Summary has been reviewed and is understood.  Full responsibility of the confidentiality of this discharge information lies with you and/or your care-partner.

## 2019-07-05 NOTE — Progress Notes (Signed)
Called to room to assist during endoscopic procedure.  Patient ID and intended procedure confirmed with present staff. Received instructions for my participation in the procedure from the performing physician.  

## 2019-07-05 NOTE — Progress Notes (Signed)
Pt's states no medical or surgical changes since previsit or office visit.  Vitals - DT Temp -  JB

## 2019-07-05 NOTE — Progress Notes (Signed)
Report given to PACU, vss 

## 2019-07-05 NOTE — Op Note (Signed)
[facility] Patient Name: Julie Lee Procedure Date: 07/05/2019 8:18 AM MRN: [id number] Endoscopist: Milus Banister , MD Age: 64 Referring MD:  Date of Birth: 1955-09-17 Gender: Female Account #: [id number] Procedure:                Colonoscopy Indications:              Hematochezia, likely Crohn's colitis colonoscopy in                            June 2015 with Dr. Ardis Hughs revealed erythema in a                            segment of the ascending colon. This biopsy showed                            mild active chronic colitis. Biopsies in other                            areas of the colon showed benign mucosa without                            inflammation or other changes. Colonoscopy, June                            2007, Dr. Richardson Landry fine, done for diarrhea, rectal                            bleeding, occult GI bleeding. Findings                            diverticulosis of the sigmoid, ulcers in the                            transverse colon descending colon and cecum which                            were described as multiple shallow nonbleeding                            ulcers ranging in size from 2-4 millimeters                            associated with mucosal friability, normal terminal                            ileum, internal hemorrhoids. Biopsies suggested                            that she had mild, chronic, active colitis this was                            in transverse, cecum and descending segments. Medicines:                Monitored Anesthesia Care Procedure:  Pre-Anesthesia Assessment:                           - Prior to the procedure, a History and Physical                            was performed, and patient medications and                            allergies were reviewed. The patient's tolerance of                            previous anesthesia was also reviewed. The risks                            and benefits of the  procedure and the sedation                            options and risks were discussed with the patient.                            All questions were answered, and informed consent                            was obtained. Prior Anticoagulants: The patient has                            taken no previous anticoagulant or antiplatelet                            agents. ASA Grade Assessment: II - A patient with                            mild systemic disease. After reviewing the risks                            and benefits, the patient was deemed in                            satisfactory condition to undergo the procedure.                           After obtaining informed consent, the colonoscope                            was passed under direct vision. Throughout the                            procedure, the patient's blood pressure, pulse, and                            oxygen saturations were monitored continuously. The  Colonoscope was introduced through the anus and                            advanced to the the terminal ileum. The colonoscopy                            was performed without difficulty. The patient                            tolerated the procedure well. The quality of the                            bowel preparation was good. The terminal ileum,                            ileocecal valve, appendiceal orifice, and rectum                            were photographed. Scope In: 8:34:21 AM Scope Out: 8:47:32 AM Scope Withdrawal Time: 0 hours 10 minutes 40 seconds  Total Procedure Duration: 0 hours 13 minutes 11 seconds  Findings:                 The terminal ileum appeared normal. Biopsies were                            taken with a cold forceps for histology. jar 1                           Mild inflammation in the ascending segment,                            biopsies taken. jar 2                           A 2 mm polyp was found in the  cecum. The polyp was                            sessile. The polyp was removed with a cold snare.                            Resection and retrieval were complete. jar 3                           The mucosa throughout transverse, descending,                            sigmoid and rectum was normal, biopsies taken. jar                            4.                           Multiple small and large-mouthed diverticula were  found in the left colon.                           The exam was otherwise without abnormality on                            direct and retroflexion views.                           No evidence of colovaginal fistula. Complications:            No immediate complications. Estimated blood loss:                            None. Estimated Blood Loss:     Estimated blood loss: none. Impression:               - The examined portion of the ileum was normal.                            Biopsied.                           - Mild inflammation in the ascending colon.                            Biopsied.                           - One 2 mm polyp in the cecum, removed with a cold                            snare. Resected and retrieved.                           - Normal mucosa in transverse, descending, sigmoid                            and rectum was normal. Biopsied                           - Diverticulosis in the left colon.                           - The examination was otherwise normal on direct                            and retroflexion views. Recommendation:           - Patient has a contact number available for                            emergencies. The signs and symptoms of potential                            delayed complications were discussed with the  patient. Return to normal activities tomorrow.                            Written discharge instructions were provided to the                             patient.                           - Resume previous diet.                           - Continue present medications.                           - Await pathology results.                           - OV with Dr. Ardis Hughs in 6-8 weeks as well. Milus Banister, MD 07/05/2019 8:55:00 AM This report has been signed electronically.

## 2019-07-08 ENCOUNTER — Telehealth: Payer: Self-pay | Admitting: Gastroenterology

## 2019-07-09 ENCOUNTER — Telehealth: Payer: Self-pay

## 2019-07-09 NOTE — Telephone Encounter (Signed)
  Follow up Call-  Call back number 07/05/2019  Post procedure Call Back phone  # [phone]  Permission to leave phone message Yes  Some recent data might be hidden     Patient questions:  Do you have a fever, pain , or abdominal swelling? No. Pain Score  0 *  Have you tolerated food without any problems? Yes.    Have you been able to return to your normal activities? Yes.    Do you have any questions about your discharge instructions: Diet   No. Medications  No. Follow up visit  No.  Do you have questions or concerns about your Care? No.  Actions: * If pain score is 4 or above: No action needed, pain <4.  Have you developed a fever since your procedure? No 2.   Have you had an respiratory symptoms (SOB or cough) since your procedure? No  3.   Have you tested positive for COVID 19 since your procedure No  4.   Have you had any family members/close contacts diagnosed with the COVID 19 since your procedure? No  If yes to any of these questions please route to Joylene John, RN and Erenest Rasher, RN

## 2019-07-10 ENCOUNTER — Encounter: Payer: Self-pay | Admitting: Gastroenterology

## 2019-07-11 ENCOUNTER — Other Ambulatory Visit: Payer: Self-pay | Admitting: Family Medicine

## 2019-07-11 NOTE — Telephone Encounter (Signed)
Requested medication (s) are due for refill today: Yes  Requested medication (s) are on the active medication list: Yes  Last refill:  06/10/19  Future visit scheduled: No  Notes to clinic:  See request.    Requested Prescriptions  Pending Prescriptions Disp Refills   traMADol (ULTRAM) 50 MG tablet [Pharmacy Med Name: traMADol HCl 50 MG Oral Tablet] 90 tablet 0    Sig: TAKE 1 TO 2 TABLETS BY MOUTH EVERY 12 HOURS AS NEEDED      Not Delegated - Analgesics:  Opioid Agonists Failed - 07/11/2019  8:04 AM      Failed - This refill cannot be delegated      Failed - Urine Drug Screen completed in last 360 days.      Passed - Valid encounter within last 6 months    Recent Outpatient Visits           5 months ago Welcome to [employer] preventive visit   [facility] at [place], MD   10 months ago Estrogen deficiency   [facility] at [facility], Arlie Solomons, MD   1 year ago Essential hypertension   Primary Care at [facility], Arlie Solomons, MD   1 year ago Osteopenia of multiple sites   [facility], Damaris Hippo, PA-C   1 year ago Essential hypertension   [facility], Damaris Hippo, PA-C

## 2019-07-15 ENCOUNTER — Other Ambulatory Visit: Payer: Self-pay

## 2019-07-15 ENCOUNTER — Other Ambulatory Visit: Payer: Self-pay | Admitting: Family Medicine

## 2019-07-15 NOTE — Telephone Encounter (Signed)
Requested medication (s) are due for refill today: yes  Requested medication (s) are on the active medication list: yes  Last refill: 06/10/19  Future visit scheduled: no  Notes to clinic:  not delegated    Requested Prescriptions  Pending Prescriptions Disp Refills   traMADol (ULTRAM) 50 MG tablet 90 tablet 0    Sig: TAKE 1 TO 2 TABLETS BY MOUTH EVERY 12 HOURS AS NEEDED      Not Delegated - Analgesics:  Opioid Agonists Failed - 07/15/2019 12:54 PM      Failed - This refill cannot be delegated      Failed - Urine Drug Screen completed in last 360 days.      Failed - Valid encounter within last 6 months    Recent Outpatient Visits           6 months ago [facility] to [facility] preventive visit   [facility] at [place], MD   10 months ago Estrogen deficiency   Primary Care at [facility], Arlie Solomons, MD   1 year ago Essential hypertension   Primary Care at [facility], Arlie Solomons, MD   1 year ago Osteopenia of multiple sites   [facility], Damaris Hippo, PA-C   1 year ago Essential hypertension   [facility], Damaris Hippo, PA-C

## 2019-07-15 NOTE — Telephone Encounter (Signed)
Copied from [place] [phone]. Topic: Quick Communication - Rx Refill/Question >> Jul 15, 2019 12:49 PM Leward Quan A wrote: Medication: traMADol (ULTRAM) 50 MG tablet   Has the patient contacted their pharmacy? Yes.   (Agent: If no, request that the patient contact the pharmacy for the refill.) (Agent: If yes, when and what did the pharmacy advise?)  Preferred Pharmacy (with phone number or street name): [place], [place]  Phone:  [phone] Fax:  [phone]     Agent: Please be advised that RX refills may take up to 3 business days. We ask that you follow-up with your pharmacy.

## 2019-07-16 MED ORDER — TRAMADOL HCL 50 MG PO TABS: ORAL_TABLET | ORAL | 0 refills | Status: DC

## 2019-07-16 NOTE — Telephone Encounter (Signed)
Pt called back to check status, states that she will run out of her current supply before Friday. Please advise

## 2019-07-16 NOTE — Telephone Encounter (Signed)
Patient is requesting a refill of the following medications: Requested Prescriptions   Pending Prescriptions Disp Refills  . traMADol (ULTRAM) 50 MG tablet 90 tablet 0    Sig: TAKE 1 TO 2 TABLETS BY MOUTH EVERY 12 HOURS AS NEEDED    Date of patient request: 07/15/2019 Last office visit: 01/15/2019 Date of last refill:06/10/2019 Last refill amount: 90 tablets Follow up time period per chart: N/A

## 2019-07-25 ENCOUNTER — Ambulatory Visit: Admission: RE | Admit: 2019-07-25 | Payer: Medicare Other | Source: Ambulatory Visit

## 2019-08-14 ENCOUNTER — Other Ambulatory Visit: Payer: Self-pay | Admitting: Family Medicine

## 2019-08-14 DIAGNOSIS — I10 Essential (primary) hypertension: Secondary | ICD-10-CM

## 2019-08-14 MED ORDER — LISINOPRIL 20 MG PO TABS: 20.0000 mg | ORAL_TABLET | Freq: Every day | ORAL | 0 refills | Status: DC

## 2019-08-14 MED ORDER — ATENOLOL 50 MG PO TABS: 50.0000 mg | ORAL_TABLET | Freq: Every day | ORAL | 0 refills | Status: DC

## 2019-08-14 NOTE — Telephone Encounter (Signed)
Attempted to call patient to schedule 6 month follow up- left message to call back to office to schedule. Courtesy RF given #[phone]

## 2019-08-14 NOTE — Telephone Encounter (Signed)
Copied from [place] [id number]. Topic: Quick Communication - Rx Refill/Question >> Aug 14, 2019 10:11 AM Rainey Pines A wrote: Medication:atenolol (TENORMIN) 50 MG tablet ,lisinopril (ZESTRIL) 20 MG tablet (Patient is requesting a callback from nurse once medication has been sent to pharmacy.)  Has the patient contacted their pharmacy? {yes (Agent: If no, request that the patient contact the pharmacy for the refill.) (Agent: If yes, when and what did the pharmacy advise?)contact PCP  Preferred Pharmacy (with phone number or street name): [facility], [place]  Phone:  [phone] Fax:  [phone]     Agent: Please be advised that RX refills may take up to 3 business days. We ask that you follow-up with your pharmacy.

## 2019-08-22 ENCOUNTER — Other Ambulatory Visit: Payer: Self-pay

## 2019-08-22 ENCOUNTER — Telehealth (INDEPENDENT_AMBULATORY_CARE_PROVIDER_SITE_OTHER): Payer: Medicare Other | Admitting: Family Medicine

## 2019-08-22 ENCOUNTER — Encounter: Payer: Self-pay | Admitting: Family Medicine

## 2019-08-22 DIAGNOSIS — I10 Essential (primary) hypertension: Secondary | ICD-10-CM

## 2019-08-22 DIAGNOSIS — K51919 Ulcerative colitis, unspecified with unspecified complications: Secondary | ICD-10-CM | POA: Diagnosis not present

## 2019-08-22 DIAGNOSIS — M159 Polyosteoarthritis, unspecified: Secondary | ICD-10-CM

## 2019-08-22 DIAGNOSIS — M8949 Other hypertrophic osteoarthropathy, multiple sites: Secondary | ICD-10-CM

## 2019-08-22 MED ORDER — LISINOPRIL 20 MG PO TABS: 20.0000 mg | ORAL_TABLET | Freq: Every day | ORAL | 0 refills | Status: DC

## 2019-08-22 MED ORDER — ATENOLOL 50 MG PO TABS: 50.0000 mg | ORAL_TABLET | Freq: Every day | ORAL | 0 refills | Status: DC

## 2019-08-22 MED ORDER — TRAMADOL HCL 50 MG PO TABS: ORAL_TABLET | ORAL | 2 refills | Status: DC

## 2019-08-22 NOTE — Progress Notes (Signed)
Virtual Visit Note  I connected with patient on 08/22/19 at 548pm by video epic and verified that I am speaking with the correct person using two identifiers. Julie Lee is currently located at home and patient is currently with them during visit. The provider, Rutherford Guys, MD is located in their office at time of visit.  I discussed the limitations, risks, security and privacy concerns of performing an evaluation and management service by telephone and the availability of in person appointments. I also discussed with the patient that there may be a patient responsible charge related to this service. The patient expressed understanding and agreed to proceed.   I provided 7 minutes of non-face-to-face time during this encounter.  Chief Complaint  Patient presents with  . Medication Refill    atenolol , lisinopril, tramadol    HPI ? PMH: OA multiple joints, HTN, UC, osteopenia  Previous PCP Dr Nolon Rod, last OV Nov 2020 She has an appointment with me in august Her GI is Dr Ardis Hughs  BP well controlled on lisinopril and atenolol She has been on tramadol scheduled for over 10 years for her OA pain as she cant do NSAIDs due to her UC She is currently having flareups hands, left hip and left knee She denies any side effects from her medications  Lab Results  Component Value Date   CREATININE 0.65 01/15/2019   BUN 14 01/15/2019   NA 141 01/15/2019   K 4.7 01/15/2019   CL 103 01/15/2019   CO2 20 01/15/2019   BP Readings from Last 3 Encounters:  07/05/19 104/80  06/20/19 130/80  03/14/19 119/78    No Known Allergies  Prior to Admission medications   Medication Sig Start Date End Date Taking? Authorizing Provider  atenolol (TENORMIN) 50 MG tablet Take 1 tablet (50 mg total) by mouth daily. 08/14/19  Yes Rutherford Guys, MD  balsalazide (COLAZAL) 750 MG capsule Take 3 capsules (2,250 mg total) by mouth 3 (three) times daily. 06/20/19  Yes Zehr, Laban Emperor, PA-C    lisinopril (ZESTRIL) 20 MG tablet Take 1 tablet (20 mg total) by mouth daily. 08/14/19  Yes Rutherford Guys, MD  Multiple Vitamins-Minerals (MULTIVITAMIN GUMMIES ADULT) CHEW Chew 1 tablet by mouth daily.   Yes [provider]  traMADol (ULTRAM) 50 MG tablet TAKE 1 TO 2 TABLETS BY MOUTH EVERY 12 HOURS AS NEEDED 07/16/19  Yes Stallings, Zoe A, MD  cyclobenzaprine (FLEXERIL) 10 MG tablet Take 1 tablet (10 mg total) by mouth 2 (two) times daily as needed for muscle spasms. Patient not taking: Reported on 08/22/2019 03/30/18   Forrest Moron, MD  diazepam (VALIUM) 10 MG tablet Take 1 tablet (10 mg total) by mouth every 12 (twelve) hours as needed. for anxiety Patient not taking: Reported on 08/22/2019 01/15/19   Forrest Moron, MD    Past Medical History:  Diagnosis Date  . Allergy   . Anemia   . Anxiety   . Arthritis   . Colitis   . Hypertension   . Kidney stones   . UTI (lower urinary tract infection)     Past Surgical History:  Procedure Laterality Date  . CESAREAN SECTION    . COLONOSCOPY  07/05/2019  . TUBAL LIGATION      Social History   Tobacco Use  . Smoking status: Never Smoker  . Smokeless tobacco: Never Used  Substance Use Topics  . Alcohol use: Yes    Comment: rarely    Family  History  Problem Relation Age of Onset  . Heart disease Father   . Lung cancer Father   . Hyperlipidemia Mother   . Colitis Brother   . Drug abuse Brother   . Colon cancer Maternal Uncle 64  . Diabetes Paternal Uncle   . Diabetes Paternal Grandfather   . Heart disease Paternal Grandfather   . Kidney disease Paternal Uncle   . Heart disease Maternal Grandfather   . Esophageal cancer Neg Hx   . Rectal cancer Neg Hx   . Stomach cancer Neg Hx     Review of Systems  Constitutional: Negative for chills and fever.  Respiratory: Negative for cough and shortness of breath.   Cardiovascular: Negative for chest pain, palpitations and leg swelling.  Gastrointestinal: Negative for  abdominal pain, nausea and vomiting.    Objective  Vitals as reported by the patient: none  GEN: AAOx3, NAD HEENT: [place]/AT, pupils are symmetrical, EOMI, non-icteric sclera Resp: breathing comfortably, speaking in full sentences Skin: no rashes noted, no pallor Psych: good eye contact, normal mood and affect   ASSESSMENT and PLAN  1. Essential hypertension Controlled. Continue current regime.  - atenolol (TENORMIN) 50 MG tablet; Take 1 tablet (50 mg total) by mouth daily. - lisinopril (ZESTRIL) 20 MG tablet; Take 1 tablet (20 mg total) by mouth daily. - Lipid panel; Future - Comprehensive metabolic panel; Future  2. Primary osteoarthritis involving multiple joints Stable, cont current regime, pmp reviewed  3. Ulcerative colitis with complication, unspecified location [facility]) Managed by GI  Other orders - traMADol (ULTRAM) 50 MG tablet; TAKE 1 TO 2 TABLETS BY MOUTH EVERY 12 HOURS AS NEEDED  FOLLOW-UP: as scheduled   The above assessment and management plan was discussed with the patient. The patient verbalized understanding of and has agreed to the management plan. Patient is aware to call the clinic if symptoms persist or worsen. Patient is aware when to return to the clinic for a follow-up visit. Patient educated on when it is appropriate to go to the emergency department.     Rutherford Guys, MD Primary Care at [place], [place] [address] Ph.  [phone] Fax [phone]

## 2019-08-22 NOTE — Patient Instructions (Signed)
° ° ° °  If you have lab work done today you will be contacted with your lab results within the next 2 weeks.  If you have not heard from us then please contact us. The fastest way to get your results is to register for My Chart. ° ° °IF you received an x-ray today, you will receive an invoice from [facility]. Please contact [facility] at [phone] with questions or concerns regarding your invoice.  ° °IF you received labwork today, you will receive an invoice from [facility]. Please contact [facility] at [phone] with questions or concerns regarding your invoice.  ° °Our billing staff will not be able to assist you with questions regarding bills from these companies. ° °You will be contacted with the lab results as soon as they are available. The fastest way to get your results is to activate your My Chart account. Instructions are located on the last page of this paperwork. If you have not heard from us regarding the results in 2 weeks, please contact this office. °  ° ° ° °

## 2019-09-03 ENCOUNTER — Ambulatory Visit: Payer: Medicare Other | Admitting: Gastroenterology

## 2019-09-03 ENCOUNTER — Encounter: Payer: Self-pay | Admitting: Gastroenterology

## 2019-09-03 VITALS — BP 128/70 | HR 62 | Ht 64.0 in | Wt 175.0 lb

## 2019-09-03 DIAGNOSIS — K50119 Crohn's disease of large intestine with unspecified complications: Secondary | ICD-10-CM

## 2019-09-03 NOTE — Progress Notes (Signed)
Review of pertinent gastrointestinal problems: 1.  Eosinophilic esophagitis, responsive to proton pump inhibitor  Upper endoscopy, June 2007, Dr. Richardson Landry fine, done for abdominal pain, nausea vomiting, dysphagia, diarrhea. Findings scattered erosions in the antrum, normal otherwise. She was recommended to stop diclofenac. 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 Biopsies of the duodenum were normal.  EGD June 2015 Dr. Ardis Hughs was normal, biopsies were taken to check for eosinophilic esophagitis.  Biopsies again suggestive of eosinophilic changes.  She was recommended to start swallowed fluticasone twice daily.  Told to follow-up in 6 or 7 weeks but never did.  2.  Crohn's colitis colonoscopy, June 2007, Dr. Richardson Landry fine, done for diarrhea, rectal bleeding, occult GI bleeding. Findings diverticulosis of the sigmoid, ulcers in the transverse colon descending colon and cecum which were described as multiple shallow nonbleeding ulcers ranging in size from 2-4 millimeters associated with mucosal friability, normal terminal ileum, internal hemorrhoids. Biopsies suggested that she had mild, chronic, active colitis this was in transverse, cecum and descending segments.  Colonoscopy June 2015 Dr. Ardis Hughs showed normal terminal ileum.  Mild erythema in the ascending segment.  Biopsies suggested chronic inflammation.  She was recommended to stay on Lialda twice daily and follow-up in 6 to 7 weeks.  Never followed up until 2021.  Repeat colonoscopy Dr. Ardis Hughs April 2021 showed normal terminal ileum.  There was a 2 mm polyp removed from the cecum.  The mucosa in the a sending was slightly inflamed appearing.  Mucosa throughout the transverse, descending, sigmoid, rectum were all normal.  Multiple biopsies taken throughout.  Pathology showed mild active inflammation at the polyp site only.  She was recommended continue colazoll 3 pills 3 times daily.   HPI: This is a very pleasant 64 year old  woman whom I last saw the time of her colonoscopy about 2 months ago.  Since then she has been taking colas all 750 3 times daily on a daily scheduled basis and overall feels quite a bit better.  Her right lower quadrant pains have resolved.  Her stools have solidified and she is no longer bothered by urgency.  She did describe of pretty unusual issue following her colonoscopy in April.  She tells me that for 2 to 3 weeks afterwards she was having rectal and also vaginal bleeding at the same time.  Fairly low volume but definitely she was noticing bleeding just about every day for 2 to 3 weeks and then it just resolved.  She tells me she has had a gynecologic work-up recently and was reassured that there was no sign of anything serious such as a cancer going on.   ROS: complete GI ROS as described in HPI, all other review negative.  Constitutional:  No unintentional weight loss   Past Medical History:  Diagnosis Date  . Allergy   . Anemia   . Anxiety   . Arthritis   . Colitis   . Hypertension   . Kidney stones   . UTI (lower urinary tract infection)     Past Surgical History:  Procedure Laterality Date  . CESAREAN SECTION    . COLONOSCOPY  07/05/2019  . TUBAL LIGATION      Current Outpatient Medications  Medication Sig Dispense Refill  . atenolol (TENORMIN) 50 MG tablet Take 1 tablet (50 mg total) by mouth daily. 90 tablet 0  . balsalazide (COLAZAL) 750 MG capsule Take 3 capsules (2,250 mg total) by mouth 3 (three) times daily. 270 capsule 3  . diazepam (VALIUM)  10 MG tablet Take 10 mg by mouth every 6 (six) hours as needed for anxiety.    Marland Kitchen lisinopril (ZESTRIL) 20 MG tablet Take 1 tablet (20 mg total) by mouth daily. 90 tablet 0  . Multiple Vitamins-Minerals (MULTIVITAMIN GUMMIES ADULT) CHEW Chew 1 tablet by mouth daily.    . traMADol (ULTRAM) 50 MG tablet TAKE 1 TO 2 TABLETS BY MOUTH EVERY 12 HOURS AS NEEDED 90 tablet 2   No current facility-administered medications for this  visit.    Allergies as of 09/03/2019  . (No Known Allergies)    Family History  Problem Relation Age of Onset  . Heart disease Father   . Lung cancer Father   . Hyperlipidemia Mother   . Colitis Brother   . Drug abuse Brother   . Colon cancer Maternal Uncle 90  . Diabetes Paternal Uncle   . Diabetes Paternal Grandfather   . Heart disease Paternal Grandfather   . Kidney disease Paternal Uncle   . Heart disease Maternal Grandfather   . Esophageal cancer Neg Hx   . Rectal cancer Neg Hx   . Stomach cancer Neg Hx   . Pancreatic cancer Neg Hx     Social History   Socioeconomic History  . Marital status: Single    Spouse name: Not on file  . Number of children: Not on file  . Years of education: Not on file  . Highest education level: Not on file  Occupational History  . Occupation: selfemployed  Tobacco Use  . Smoking status: Never Smoker  . Smokeless tobacco: Never Used  Vaping Use  . Vaping Use: Never used  Substance and Sexual Activity  . Alcohol use: Yes    Comment: rarely  . Drug use: No  . Sexual activity: Not Currently    Birth control/protection: Abstinence  Other Topics Concern  . Not on file  Social History Narrative   Lives alone   Good relationship with son and granddaughter   Social Determinants of Health   Financial Resource Strain:   . Difficulty of Paying Living Expenses:   Food Insecurity:   . Worried About Charity fundraiser in the Last Year:   . Arboriculturist in the Last Year:   Transportation Needs:   . Film/video editor (Medical):   Marland Kitchen Lack of Transportation (Non-Medical):   Physical Activity:   . Days of Exercise per Week:   . Minutes of Exercise per Session:   Stress:   . Feeling of Stress :   Social Connections:   . Frequency of Communication with Friends and Family:   . Frequency of Social Gatherings with Friends and Family:   . Attends Religious Services:   . Active Member of Clubs or Organizations:   . Attends English as a second language teacher Meetings:   Marland Kitchen Marital Status:   Intimate Partner Violence:   . Fear of Current or Ex-Partner:   . Emotionally Abused:   Marland Kitchen Physically Abused:   . Sexually Abused:      Physical Exam: Ht 5' 4"  (1.626 m) Comment: W/O SHOES  Wt 175 lb (79.4 kg)   BMI 30.04 kg/m  Constitutional: generally well-appearing Psychiatric: alert and oriented x3 Abdomen: soft, nontender, nondistended, no obvious ascites, no peritoneal signs, normal bowel sounds No peripheral edema noted in lower extremities  Assessment and plan: 64 y.o. female with mild Crohn's colitis  Her symptoms are under good control as long as she takes balsalazide 750 3 times daily on a  scheduled basis.  She has tried Lialda in the past and this caused her to lose some hair.  She will return to see me in 1 year and sooner if needed.  She also mentioned that her dysphagia is completely resolved as long she takes Nexium once daily.  She will continue on that.  I cannot really explain her mild rectal bleeding at the same time as vaginal bleeding that occurred for 2 to 3 weeks following her colonoscopy.  She knows to call here if it happens again however.  Please see the "Patient Instructions" section for addition details about the plan.  Owens Loffler, MD [facility] 09/03/2019, 9:03 AM   Total time on date of encounter was 30 minutes (this included time spent preparing to see the patient reviewing records; obtaining and/or reviewing separately obtained history; performing a medically appropriate exam and/or evaluation; counseling and educating the patient and family if present; ordering medications, tests or procedures if applicable; and documenting clinical information in the health record).

## 2019-09-03 NOTE — Patient Instructions (Signed)
If you are age 66 or older, your body mass index should be between 23-30. Your Body mass index is 30.04 kg/m. If this is out of the aforementioned range listed, please consider follow up with your Primary Care Provider.  If you are age 38 or younger, your body mass index should be between 19-25. Your Body mass index is 30.04 kg/m. If this is out of the aformentioned range listed, please consider follow up with your Primary Care Provider.   You will follow up in our office in 1 year (June 2023).  Please continue Colazal and Nexium.  Thank you for entrusting me with your care and choosing [facility].  Dr Ardis Hughs

## 2019-10-28 ENCOUNTER — Other Ambulatory Visit: Payer: Self-pay

## 2019-10-28 ENCOUNTER — Ambulatory Visit (INDEPENDENT_AMBULATORY_CARE_PROVIDER_SITE_OTHER): Payer: Medicare Other | Admitting: Family Medicine

## 2019-10-28 ENCOUNTER — Encounter: Payer: Self-pay | Admitting: Family Medicine

## 2019-10-28 VITALS — BP 140/86 | HR 61 | Temp 97.6°F | Ht 64.0 in | Wt 172.4 lb

## 2019-10-28 DIAGNOSIS — R300 Dysuria: Secondary | ICD-10-CM

## 2019-10-28 DIAGNOSIS — M159 Polyosteoarthritis, unspecified: Secondary | ICD-10-CM

## 2019-10-28 DIAGNOSIS — M150 Primary generalized (osteo)arthritis: Secondary | ICD-10-CM

## 2019-10-28 DIAGNOSIS — M8949 Other hypertrophic osteoarthropathy, multiple sites: Secondary | ICD-10-CM | POA: Diagnosis not present

## 2019-10-28 DIAGNOSIS — I10 Essential (primary) hypertension: Secondary | ICD-10-CM | POA: Diagnosis not present

## 2019-10-28 DIAGNOSIS — K51919 Ulcerative colitis, unspecified with unspecified complications: Secondary | ICD-10-CM

## 2019-10-28 DIAGNOSIS — F411 Generalized anxiety disorder: Secondary | ICD-10-CM

## 2019-10-28 LAB — POCT URINALYSIS DIP (MANUAL ENTRY)
Bilirubin, UA: NEGATIVE
Glucose, UA: NEGATIVE mg/dL
Ketones, POC UA: NEGATIVE mg/dL
Leukocytes, UA: NEGATIVE
Nitrite, UA: NEGATIVE
Protein Ur, POC: NEGATIVE mg/dL
Spec Grav, UA: >=1.03 — AB (ref 1.010–1.025)
Urobilinogen, UA: 0.2 E.U./dL
pH, UA: 6 (ref 5.0–8.0)

## 2019-10-28 LAB — POC MICROSCOPIC URINALYSIS (UMFC): Mucus: ABSENT

## 2019-10-28 LAB — COMPREHENSIVE METABOLIC PANEL
ALT: 12 IU/L (ref 0–32)
AST: 15 IU/L (ref 0–40)
Albumin/Globulin Ratio: 1.8 (ref 1.2–2.2)
Albumin: 4.3 g/dL (ref 3.8–4.8)
Alkaline Phosphatase: 77 IU/L (ref 48–121)
BUN/Creatinine Ratio: 15 (ref 12–28)
BUN: 13 mg/dL (ref 8–27)
Bilirubin Total: 0.2 mg/dL (ref 0.0–1.2)
CO2: 23 mmol/L (ref 20–29)
Calcium: 10 mg/dL (ref 8.7–10.3)
Chloride: 104 mmol/L (ref 96–106)
Creatinine, Ser: 0.85 mg/dL (ref 0.57–1.00)
GFR calc Af Amer: 83 mL/min/{1.73_m2} (ref >59)
GFR calc non Af Amer: 72 mL/min/{1.73_m2} (ref >59)
Globulin, Total: 2.4 g/dL (ref 1.5–4.5)
Glucose: 109 mg/dL — ABNORMAL HIGH (ref 65–99)
Potassium: 4.2 mmol/L (ref 3.5–5.2)
Sodium: 143 mmol/L (ref 134–144)
Total Protein: 6.7 g/dL (ref 6.0–8.5)

## 2019-10-28 LAB — LIPID PANEL
Chol/HDL Ratio: 4 ratio (ref 0.0–4.4)
Cholesterol, Total: 240 mg/dL — ABNORMAL HIGH (ref 100–199)
HDL: 60 mg/dL (ref >39)
LDL Chol Calc (NIH): 153 mg/dL — ABNORMAL HIGH (ref 0–99)
Triglycerides: 150 mg/dL — ABNORMAL HIGH (ref 0–149)
VLDL Cholesterol Cal: 27 mg/dL (ref 5–40)

## 2019-10-28 MED ORDER — ATENOLOL 50 MG PO TABS: 50.0000 mg | ORAL_TABLET | Freq: Every day | ORAL | 1 refills | Status: AC

## 2019-10-28 MED ORDER — ESOMEPRAZOLE MAGNESIUM 20 MG PO CPDR: 20.0000 mg | DELAYED_RELEASE_CAPSULE | Freq: Every day | ORAL | Status: DC

## 2019-10-28 MED ORDER — DICLOFENAC SODIUM 1 % EX GEL: 2.0000 g | Freq: Four times a day (QID) | CUTANEOUS | 2 refills | Status: AC

## 2019-10-28 MED ORDER — TRAMADOL HCL 50 MG PO TABS: ORAL_TABLET | ORAL | 2 refills | Status: DC

## 2019-10-28 MED ORDER — DIAZEPAM 10 MG PO TABS: 10.0000 mg | ORAL_TABLET | Freq: Four times a day (QID) | ORAL | 0 refills | Status: DC | PRN

## 2019-10-28 MED ORDER — LISINOPRIL 20 MG PO TABS: 20.0000 mg | ORAL_TABLET | Freq: Every day | ORAL | 1 refills | Status: AC

## 2019-10-28 MED ORDER — TRAMADOL HCL 50 MG PO TABS: ORAL_TABLET | ORAL | 5 refills | Status: AC

## 2019-10-28 NOTE — Patient Instructions (Signed)
° ° ° °  If you have lab work done today you will be contacted with your lab results within the next 2 weeks.  If you have not heard from us then please contact us. The fastest way to get your results is to register for My Chart. ° ° °IF you received an x-ray today, you will receive an invoice from [facility]. Please contact  Radiology at [phone] with questions or concerns regarding your invoice.  ° °IF you received labwork today, you will receive an invoice from [facility]. Please contact [facility] at [phone] with questions or concerns regarding your invoice.  ° °Our billing staff will not be able to assist you with questions regarding bills from these companies. ° °You will be contacted with the lab results as soon as they are available. The fastest way to get your results is to activate your My Chart account. Instructions are located on the last page of this paperwork. If you have not heard from us regarding the results in 2 weeks, please contact this office. °  ° ° ° °

## 2019-10-28 NOTE — Progress Notes (Signed)
8/23/20218:24 AM  Julie Lee August 27, 1955, 64 y.o., female [id number]  Chief Complaint  Patient presents with   Transitions Of Care    htn, anxiety   Medication Refill    tramadol, valium    HPI:   Patient is a 64 y.o. female with past medical history significant for OA multiple joints, HTN, chrons colitis, eosinophilic esophagitis, osteopenia who presents today for for followup  Last OV June 2021 - telemedicine, no changes Last GI appt June 2021 - no changes  She is overall doing well She takes lisinopril and atenolol for her BP She denies any side effects She takes tramadol 2 tabs AM and sometimes a 3rd in the evening for OA  She is requesting rx for diclofenac gel which she has tried from a friend and it worked well, she is not able to take oral NSAIDs due to GI chronic She thinks she has a uti due to dysuria, urgency x 4-5 days, she feels it mostly at night She is requesting refill valium, she takes very prn as stress really triggers her colitis She is having increased stressors, struggling with personal life issues and some changes in her health  Requesting referral to therapy Last refill nov 2020 pmp reviewed  Depression screen [facility] 2/9 08/22/2019 01/15/2019 08/24/2018  Decreased Interest 0 0 0  Down, Depressed, Hopeless 0 0 0  PHQ - 2 Score 0 0 0  Altered sleeping - - -  Tired, decreased energy - - -  Change in appetite - - -  Feeling bad or failure about yourself  - - -  Trouble concentrating - - -  Moving slowly or fidgety/restless - - -  Suicidal thoughts - - -  PHQ-9 Score - - -  Difficult doing work/chores - - -    Fall Risk  08/22/2019 01/15/2019 08/24/2018 03/30/2018 08/11/2017  Falls in the past year? 0 0 0 0 No  Number falls in past yr: 0 0 0 - -  Injury with Fall? 0 0 0 - -  Risk Factor Category  - - - - -  Risk for fall due to : - - - - -  Follow up Falls evaluation completed - Falls evaluation completed - -     No Known Allergies  Prior to  Admission medications   Medication Sig Start Date End Date Taking? Authorizing Provider  atenolol (TENORMIN) 50 MG tablet Take 1 tablet (50 mg total) by mouth daily. 08/22/19  Yes Julie Guys, MD  balsalazide (COLAZAL) 750 MG capsule Take 3 capsules (2,250 mg total) by mouth 3 (three) times daily. 06/20/19  Yes Zehr, Laban Emperor, PA-C  diazepam (VALIUM) 10 MG tablet Take 10 mg by mouth every 6 (six) hours as needed for anxiety.   Yes [provider]  lisinopril (ZESTRIL) 20 MG tablet Take 1 tablet (20 mg total) by mouth daily. 08/22/19  Yes Julie Guys, MD  Multiple Vitamins-Minerals (MULTIVITAMIN GUMMIES ADULT) CHEW Chew 1 tablet by mouth daily.   Yes [provider]  traMADol (ULTRAM) 50 MG tablet TAKE 1 TO 2 TABLETS BY MOUTH EVERY 12 HOURS AS NEEDED 08/22/19  Yes Julie Guys, MD    Past Medical History:  Diagnosis Date   Allergy    Anemia    Anxiety    Arthritis    Colitis    Hypertension    Kidney stones    UTI (lower urinary tract infection)     Past Surgical History:  Procedure Laterality  Date   CESAREAN SECTION     COLONOSCOPY  07/05/2019   TUBAL LIGATION      Social History   Tobacco Use   Smoking status: Never Smoker   Smokeless tobacco: Never Used  Substance Use Topics   Alcohol use: Yes    Comment: rarely    Family History  Problem Relation Age of Onset   Heart disease Father    Lung cancer Father    Hyperlipidemia Mother    Colitis Brother    Drug abuse Brother    Colon cancer Maternal Uncle 62   Diabetes Paternal Uncle    Diabetes Paternal Grandfather    Heart disease Paternal Grandfather    Kidney disease Paternal Uncle    Heart disease Maternal Grandfather    Esophageal cancer Neg Hx    Rectal cancer Neg Hx    Stomach cancer Neg Hx    Pancreatic cancer Neg Hx     Review of Systems  Constitutional: Negative for chills and fever.  Respiratory: Negative for cough and shortness of breath.    Cardiovascular: Negative for chest pain, palpitations and leg swelling.  Gastrointestinal: Negative for abdominal pain, nausea and vomiting.  per hpi   OBJECTIVE:  Today's Vitals   10/28/19 0820  BP: 140/86  Pulse: 61  Temp: 97.6 F (36.4 C)  SpO2: 95%  Weight: 172 lb 6.4 oz (78.2 kg)  Height: 5' 4"  (1.626 m)   Body mass index is 29.59 kg/m.   Physical Exam Vitals and nursing note reviewed.  Constitutional:      Appearance: She is well-developed.  HENT:     Head: Normocephalic and atraumatic.     Mouth/Throat:     Pharynx: No oropharyngeal exudate.  Eyes:     General: No scleral icterus.    Extraocular Movements: Extraocular movements intact.     Conjunctiva/sclera: Conjunctivae normal.     Pupils: Pupils are equal, round, and reactive to light.  Cardiovascular:     Rate and Rhythm: Normal rate and regular rhythm.     Heart sounds: Normal heart sounds. No murmur heard.  No friction rub. No gallop.   Pulmonary:     Effort: Pulmonary effort is normal.     Breath sounds: Normal breath sounds. No wheezing, rhonchi or rales.  Musculoskeletal:     Cervical back: Neck supple.  Skin:    General: Skin is warm and dry.  Neurological:     Mental Status: She is alert and oriented to person, place, and time.     Results for orders placed or performed in visit on 10/28/19 (from the past 24 hour(s))  POCT urinalysis dipstick     Status: Abnormal   Collection Time: 10/28/19  9:02 AM  Result Value Ref Range   Color, UA yellow yellow   Clarity, UA clear clear   Glucose, UA negative negative mg/dL   Bilirubin, UA negative negative   Ketones, POC UA negative negative mg/dL   Spec Grav, UA >=1.030 (A) 1.010 - 1.025   Blood, UA trace-lysed (A) negative   pH, UA 6.0 5.0 - 8.0   Protein Ur, POC negative negative mg/dL   Urobilinogen, UA 0.2 0.2 or 1.0 E.U./dL   Nitrite, UA Negative Negative   Leukocytes, UA Negative Negative  POCT Microscopic Urinalysis ([facility])     Status:  None   Collection Time: 10/28/19  9:12 AM  Result Value Ref Range   WBC,UR,HPF,POC None None WBC/hpf   RBC,UR,HPF,POC None None RBC/hpf   Bacteria  None None, Too numerous to count   Mucus Absent Absent   Epithelial Cells, UR Per Microscopy None None, Too numerous to count cells/hpf    No results found.   ASSESSMENT and PLAN  1. Essential hypertension Controlled. Continue current regime. - Comprehensive metabolic panel (nonfasting) - Lipid panel (nonfasting) - atenolol (TENORMIN) 50 MG tablet; Take 1 tablet (50 mg total) by mouth daily. - lisinopril (ZESTRIL) 20 MG tablet; Take 1 tablet (20 mg total) by mouth daily.  2. Anxiety state Discussed valium r/se/b, discussed use of other medications if escalation of use. Referring for counseling as requested. - Ambulatory referral to Psychology  3. Primary osteoarthritis involving multiple joints Stable. Tramadol refilled. pmp reviewed. rx diclofenac gel given, no oral nsaids due to IBD  4. Ulcerative colitis with complication, unspecified location [facility]) Managed by GI  5. Dysuria No infection, push fluids, azo prn - POCT urinalysis dipstick - POCT Microscopic Urinalysis ([facility])  Other orders - diclofenac Sodium (VOLTAREN) 1 % GEL; Apply 2 g topically 4 (four) times daily. - diazepam (VALIUM) 10 MG tablet; Take 1 tablet (10 mg total) by mouth every 6 (six) hours as needed for anxiety. - esomeprazole (NEXIUM) 20 MG capsule; Take 1 capsule (20 mg total) by mouth daily at 12 noon. - traMADol (ULTRAM) 50 MG tablet; TAKE 1 TO 2 TABLETS BY MOUTH EVERY 12 HOURS AS NEEDED  Return in about 6 months (around 04/29/2020).    Julie Guys, MD Primary Care at [facility] [place], [place] [address] Ph.  [phone] Fax [phone]

## 2019-11-12 ENCOUNTER — Telehealth: Payer: Self-pay | Admitting: Gastroenterology

## 2019-11-12 NOTE — Telephone Encounter (Signed)
Returned call to patient and she is asking that someone from our office contact the Care Management Dept at [facility] to discuss medical diagnoses with them to help lower tier of balsalazide.  I will contact them at [phone] option [phone].  Patient agreed to plan and verbalized understanding.

## 2019-11-12 NOTE — Telephone Encounter (Signed)
Julie Lee can you please call this pt she says a prior auth was done for balsalazide incorrectly.  I am not sure if you did this or Heather. Nira Conn is the one that sent the prescription.  I looked for a copy of the form in media but I did not see it.

## 2019-11-14 NOTE — Telephone Encounter (Signed)
Spoke with Julie Lee at [facility] team and provided all clinical information that could be given.  Julie Lee will forward information to clinical team, and we will be made aware of decision.   Attempted to contact patient to make her aware of progress.  Left message for her to return call.

## 2019-11-18 ENCOUNTER — Other Ambulatory Visit: Payer: Self-pay | Admitting: Family Medicine

## 2019-11-18 MED ORDER — ATORVASTATIN CALCIUM 10 MG PO TABS: 10.0000 mg | ORAL_TABLET | Freq: Every day | ORAL | 3 refills | Status: AC

## 2019-11-20 ENCOUNTER — Ambulatory Visit: Payer: Medicare Other | Admitting: Psychology

## 2019-11-20 NOTE — Telephone Encounter (Signed)
Received letter of denial from [facility]. Patient is aware that I will continue to work on appeal by faxing a letter to [employer].

## 2019-11-22 NOTE — Telephone Encounter (Signed)
Letter mailed to [employer] and [facility]rievance [facility] on 11-22-19.  Letter was also faxed to [phone].  Will wait for response.

## 2019-11-26 NOTE — Telephone Encounter (Signed)
Left message on voicemail that balsalazide has been approved: authorization number [id number] from 11-14-19 through  11-25-20.  Medication was approved to move from the Tier 4 to the Tier 6 coverage level. Advised patient that approval letter was faxed to [facility].  Advised patient to return call with any questions or concerns.

## 2020-02-21 DIAGNOSIS — Z1231 Encounter for screening mammogram for malignant neoplasm of breast: Secondary | ICD-10-CM | POA: Diagnosis not present

## 2020-03-26 ENCOUNTER — Other Ambulatory Visit: Payer: Self-pay | Admitting: Gastroenterology

## 2020-05-06 ENCOUNTER — Telehealth: Payer: Self-pay | Admitting: Gastroenterology

## 2020-05-06 MED ORDER — BALSALAZIDE DISODIUM 750 MG PO CAPS: 2250.0000 mg | ORAL_CAPSULE | Freq: Three times a day (TID) | ORAL | 4 refills | Status: DC

## 2020-05-06 NOTE — Telephone Encounter (Signed)
Sent script to pharmacy. 

## 2020-06-09 ENCOUNTER — Other Ambulatory Visit: Payer: Self-pay

## 2020-06-09 ENCOUNTER — Other Ambulatory Visit: Payer: Self-pay | Admitting: Obstetrics & Gynecology

## 2020-06-09 ENCOUNTER — Encounter (HOSPITAL_BASED_OUTPATIENT_CLINIC_OR_DEPARTMENT_OTHER): Payer: Self-pay | Admitting: Obstetrics & Gynecology

## 2020-06-09 ENCOUNTER — Other Ambulatory Visit (HOSPITAL_COMMUNITY)
Admission: RE | Admit: 2020-06-09 | Discharge: 2020-06-09 | Disposition: A | Discharge status: Home / Self Care | Payer: Medicare Other | Source: Ambulatory Visit | Attending: Obstetrics & Gynecology | Admitting: Obstetrics & Gynecology

## 2020-06-09 DIAGNOSIS — Z20822 Contact with and (suspected) exposure to covid-19: Secondary | ICD-10-CM | POA: Diagnosis not present

## 2020-06-09 DIAGNOSIS — Z01812 Encounter for preprocedural laboratory examination: Secondary | ICD-10-CM | POA: Insufficient documentation

## 2020-06-09 NOTE — Progress Notes (Signed)
Spoke w/ via phone for pre-op interview--- PT Lab needs dos-- CBC, BMP, T&S, EKG            Lab results------ no COVID test ------ 06-09-2020 @ 1450 Arrive at ------- 1215 NPO after MN NO Solid Food.  Clear liquids from MN until--- 1115 Med rec completed Medications to take morning of surgery ----- Colazal Diabetic medication ----- n/a Patient instructed to bring photo id and insurance card day of surgery Patient aware to have Driver (ride ) / caregiver    for 24 hours after surgery  -- son, Annamary Carolin Patient Special Instructions ----- n/a Pre-Op special Istructions ----- case just added on today,  Pre-orders need second sign Patient verbalized understanding of instructions that were given at this phone interview. Patient denies shortness of breath, chest pain, fever, cough at this phone interview.

## 2020-06-10 ENCOUNTER — Encounter (HOSPITAL_BASED_OUTPATIENT_CLINIC_OR_DEPARTMENT_OTHER): Payer: Self-pay | Admitting: Obstetrics & Gynecology

## 2020-06-10 ENCOUNTER — Encounter (HOSPITAL_BASED_OUTPATIENT_CLINIC_OR_DEPARTMENT_OTHER)
Admission: RE | Disposition: A | Discharge status: Home / Self Care | Payer: Self-pay | Source: Home / Self Care | Attending: Obstetrics & Gynecology

## 2020-06-10 ENCOUNTER — Ambulatory Visit (HOSPITAL_BASED_OUTPATIENT_CLINIC_OR_DEPARTMENT_OTHER): Payer: Medicare Other | Admitting: Anesthesiology

## 2020-06-10 ENCOUNTER — Ambulatory Visit (HOSPITAL_BASED_OUTPATIENT_CLINIC_OR_DEPARTMENT_OTHER)
Admission: RE | Admit: 2020-06-10 | Discharge: 2020-06-10 | Disposition: A | Discharge status: Home / Self Care | Payer: Medicare Other | Attending: Obstetrics & Gynecology | Admitting: Obstetrics & Gynecology

## 2020-06-10 DIAGNOSIS — Z800 Family history of malignant neoplasm of digestive organs: Secondary | ICD-10-CM | POA: Insufficient documentation

## 2020-06-10 DIAGNOSIS — Z833 Family history of diabetes mellitus: Secondary | ICD-10-CM | POA: Diagnosis not present

## 2020-06-10 DIAGNOSIS — Z813 Family history of other psychoactive substance abuse and dependence: Secondary | ICD-10-CM | POA: Diagnosis not present

## 2020-06-10 DIAGNOSIS — R9389 Abnormal findings on diagnostic imaging of other specified body structures: Secondary | ICD-10-CM | POA: Diagnosis not present

## 2020-06-10 DIAGNOSIS — N840 Polyp of corpus uteri: Secondary | ICD-10-CM | POA: Diagnosis not present

## 2020-06-10 DIAGNOSIS — Z8379 Family history of other diseases of the digestive system: Secondary | ICD-10-CM | POA: Diagnosis not present

## 2020-06-10 DIAGNOSIS — N841 Polyp of cervix uteri: Secondary | ICD-10-CM | POA: Insufficient documentation

## 2020-06-10 DIAGNOSIS — Z8349 Family history of other endocrine, nutritional and metabolic diseases: Secondary | ICD-10-CM | POA: Insufficient documentation

## 2020-06-10 DIAGNOSIS — N950 Postmenopausal bleeding: Secondary | ICD-10-CM | POA: Insufficient documentation

## 2020-06-10 DIAGNOSIS — Z87442 Personal history of urinary calculi: Secondary | ICD-10-CM | POA: Insufficient documentation

## 2020-06-10 DIAGNOSIS — Z79899 Other long term (current) drug therapy: Secondary | ICD-10-CM | POA: Insufficient documentation

## 2020-06-10 DIAGNOSIS — Z8249 Family history of ischemic heart disease and other diseases of the circulatory system: Secondary | ICD-10-CM | POA: Diagnosis not present

## 2020-06-10 DIAGNOSIS — Z801 Family history of malignant neoplasm of trachea, bronchus and lung: Secondary | ICD-10-CM | POA: Diagnosis not present

## 2020-06-10 DIAGNOSIS — Z841 Family history of disorders of kidney and ureter: Secondary | ICD-10-CM | POA: Insufficient documentation

## 2020-06-10 HISTORY — DX: Prediabetes: R73.03

## 2020-06-10 HISTORY — DX: Personal history of urinary calculi: Z87.442

## 2020-06-10 HISTORY — DX: Hyperlipidemia, unspecified: E78.5

## 2020-06-10 HISTORY — PX: DILATATION & CURETTAGE/HYSTEROSCOPY WITH MYOSURE: SHX6511

## 2020-06-10 HISTORY — DX: Personal history of diseases of the blood and blood-forming organs and certain disorders involving the immune mechanism: Z86.2

## 2020-06-10 HISTORY — DX: Presence of spectacles and contact lenses: Z97.3

## 2020-06-10 HISTORY — DX: Unspecified osteoarthritis, unspecified site: M19.90

## 2020-06-10 LAB — BASIC METABOLIC PANEL
Anion gap: 7 (ref 5–15)
BUN: 20 mg/dL (ref 8–23)
CO2: 25 mmol/L (ref 22–32)
Calcium: 9.2 mg/dL (ref 8.9–10.3)
Chloride: 106 mmol/L (ref 98–111)
Creatinine, Ser: 0.74 mg/dL (ref 0.44–1.00)
GFR, Estimated: >60 mL/min (ref >60)
Glucose, Bld: 98 mg/dL (ref 70–99)
Potassium: 4.5 mmol/L (ref 3.5–5.1)
Sodium: 138 mmol/L (ref 135–145)

## 2020-06-10 LAB — CBC
HCT: 39.9 % (ref 36.0–46.0)
Hemoglobin: 12.9 g/dL (ref 12.0–15.0)
MCH: 28.2 pg (ref 26.0–34.0)
MCHC: 32.3 g/dL (ref 30.0–36.0)
MCV: 87.3 fL (ref 80.0–100.0)
Platelets: 323 10*3/uL (ref 150–400)
RBC: 4.57 MIL/uL (ref 3.87–5.11)
RDW: 13.8 % (ref 11.5–15.5)
WBC: 5.7 10*3/uL (ref 4.0–10.5)
nRBC: 0 % (ref 0.0–0.2)

## 2020-06-10 LAB — SARS CORONAVIRUS 2 (TAT 6-24 HRS): SARS Coronavirus 2: NEGATIVE

## 2020-06-10 SURGERY — DILATATION & CURETTAGE/HYSTEROSCOPY WITH MYOSURE
Anesthesia: General | Site: Vagina

## 2020-06-10 MED ORDER — FENTANYL CITRATE (PF) 100 MCG/2ML IJ SOLN
INTRAMUSCULAR | Status: AC
  Filled 2020-06-10: qty 2

## 2020-06-10 MED ORDER — MIDAZOLAM HCL 2 MG/2ML IJ SOLN: 0.5000 mg | Freq: Once | INTRAMUSCULAR | Status: DC | PRN

## 2020-06-10 MED ORDER — KETOROLAC TROMETHAMINE 30 MG/ML IJ SOLN
INTRAMUSCULAR | Status: DC | PRN
  Administered 2020-06-10: 30 mg via INTRAVENOUS

## 2020-06-10 MED ORDER — SCOPOLAMINE 1 MG/3DAYS TD PT72
MEDICATED_PATCH | TRANSDERMAL | Status: AC
  Filled 2020-06-10: qty 1

## 2020-06-10 MED ORDER — PROMETHAZINE HCL 25 MG/ML IJ SOLN: 6.2500 mg | INTRAMUSCULAR | Status: DC | PRN

## 2020-06-10 MED ORDER — IBUPROFEN 200 MG PO TABS
400.0000 mg | ORAL_TABLET | Freq: Four times a day (QID) | ORAL | 0 refills | Status: DC | PRN
Start: 2020-06-10 — End: 2020-08-31

## 2020-06-10 MED ORDER — CEFAZOLIN SODIUM-DEXTROSE 2-4 GM/100ML-% IV SOLN
INTRAVENOUS | Status: AC
  Filled 2020-06-10: qty 100

## 2020-06-10 MED ORDER — MIDAZOLAM HCL 2 MG/2ML IJ SOLN
INTRAMUSCULAR | Status: AC
  Filled 2020-06-10: qty 2

## 2020-06-10 MED ORDER — LIDOCAINE 2% (20 MG/ML) 5 ML SYRINGE
INTRAMUSCULAR | Status: DC | PRN
  Administered 2020-06-10: 20 mg via INTRAVENOUS

## 2020-06-10 MED ORDER — LIDOCAINE-EPINEPHRINE 1 %-1:100000 IJ SOLN
INTRAMUSCULAR | Status: DC | PRN
  Administered 2020-06-10: 20 mL

## 2020-06-10 MED ORDER — OXYCODONE HCL 5 MG PO TABS
5.0000 mg | ORAL_TABLET | Freq: Once | ORAL | Status: AC | PRN
  Administered 2020-06-10: 5 mg via ORAL

## 2020-06-10 MED ORDER — OXYCODONE HCL 5 MG PO TABS
ORAL_TABLET | ORAL | Status: AC
  Filled 2020-06-10: qty 1

## 2020-06-10 MED ORDER — LIDOCAINE 2% (20 MG/ML) 5 ML SYRINGE
INTRAMUSCULAR | Status: AC
  Filled 2020-06-10: qty 5

## 2020-06-10 MED ORDER — MEPERIDINE HCL 25 MG/ML IJ SOLN: 6.2500 mg | INTRAMUSCULAR | Status: DC | PRN

## 2020-06-10 MED ORDER — PROPOFOL 10 MG/ML IV BOLUS
INTRAVENOUS | Status: AC
  Filled 2020-06-10: qty 20

## 2020-06-10 MED ORDER — POVIDONE-IODINE 10 % EX SWAB: 2.0000 "application " | Freq: Once | CUTANEOUS | Status: DC

## 2020-06-10 MED ORDER — ONDANSETRON HCL 4 MG/2ML IJ SOLN
INTRAMUSCULAR | Status: AC
  Filled 2020-06-10: qty 2

## 2020-06-10 MED ORDER — LACTATED RINGERS IV SOLN: INTRAVENOUS | Status: DC

## 2020-06-10 MED ORDER — FENTANYL CITRATE (PF) 100 MCG/2ML IJ SOLN
25.0000 ug | INTRAMUSCULAR | Status: DC | PRN
  Administered 2020-06-10 (×2): 50 ug via INTRAVENOUS

## 2020-06-10 MED ORDER — OXYCODONE HCL 5 MG/5ML PO SOLN: 5.0000 mg | Freq: Once | ORAL | Status: AC | PRN

## 2020-06-10 MED ORDER — SODIUM CHLORIDE 0.9 % IR SOLN
Status: DC | PRN
  Administered 2020-06-10: 6000 mL

## 2020-06-10 MED ORDER — PHENYLEPHRINE 40 MCG/ML (10ML) SYRINGE FOR IV PUSH (FOR BLOOD PRESSURE SUPPORT)
PREFILLED_SYRINGE | INTRAVENOUS | Status: DC | PRN
  Administered 2020-06-10 (×3): 80 ug via INTRAVENOUS
  Administered 2020-06-10: 120 ug via INTRAVENOUS

## 2020-06-10 MED ORDER — CEFAZOLIN SODIUM-DEXTROSE 2-4 GM/100ML-% IV SOLN
2.0000 g | INTRAVENOUS | Status: AC
Start: 2020-06-11 — End: 2020-06-10
  Administered 2020-06-10: 2 g via INTRAVENOUS

## 2020-06-10 MED ORDER — MIDAZOLAM HCL 5 MG/5ML IJ SOLN
INTRAMUSCULAR | Status: DC | PRN
  Administered 2020-06-10: 2 mg via INTRAVENOUS

## 2020-06-10 MED ORDER — ACETAMINOPHEN 500 MG PO TABS
ORAL_TABLET | ORAL | Status: AC
  Filled 2020-06-10: qty 2

## 2020-06-10 MED ORDER — ONDANSETRON HCL 4 MG/2ML IJ SOLN
INTRAMUSCULAR | Status: DC | PRN
  Administered 2020-06-10: 4 mg via INTRAVENOUS

## 2020-06-10 MED ORDER — SCOPOLAMINE 1 MG/3DAYS TD PT72
1.0000 | MEDICATED_PATCH | TRANSDERMAL | Status: DC
  Administered 2020-06-10: 1.5 mg via TRANSDERMAL

## 2020-06-10 MED ORDER — DEXAMETHASONE SODIUM PHOSPHATE 10 MG/ML IJ SOLN
INTRAMUSCULAR | Status: DC | PRN
  Administered 2020-06-10: 5 mg via INTRAVENOUS

## 2020-06-10 MED ORDER — FENTANYL CITRATE (PF) 100 MCG/2ML IJ SOLN
INTRAMUSCULAR | Status: DC | PRN
  Administered 2020-06-10 (×2): 50 ug via INTRAVENOUS

## 2020-06-10 MED ORDER — PROPOFOL 10 MG/ML IV BOLUS
INTRAVENOUS | Status: DC | PRN
  Administered 2020-06-10: 200 mg via INTRAVENOUS

## 2020-06-10 MED ORDER — ACETAMINOPHEN 500 MG PO TABS
1000.0000 mg | ORAL_TABLET | Freq: Once | ORAL | Status: AC
  Administered 2020-06-10: 1000 mg via ORAL

## 2020-06-10 MED ORDER — DEXAMETHASONE SODIUM PHOSPHATE 10 MG/ML IJ SOLN
INTRAMUSCULAR | Status: AC
  Filled 2020-06-10: qty 1

## 2020-06-10 SURGICAL SUPPLY — 17 items
CATH ROBINSON RED A/P 16FR (CATHETERS) IMPLANT
COVER WAND RF STERILE (DRAPES) IMPLANT
DEVICE MYOSURE LITE (MISCELLANEOUS) IMPLANT
DEVICE MYOSURE REACH (MISCELLANEOUS) ×2 IMPLANT
GAUZE 4X4 16PLY RFD (DISPOSABLE) IMPLANT
GLOVE SURG ENC MOIS LTX SZ7 (GLOVE) ×2 IMPLANT
GLOVE SURG UNDER POLY LF SZ7 (GLOVE) ×6 IMPLANT
GOWN STRL REUS W/TWL LRG LVL3 (GOWN DISPOSABLE) ×4 IMPLANT
IV NS IRRIG 3000ML ARTHROMATIC (IV SOLUTION) ×4 IMPLANT
KIT PROCEDURE FLUENT (KITS) ×2 IMPLANT
KIT TURNOVER CYSTO (KITS) ×2 IMPLANT
PACK VAGINAL MINOR WOMEN LF (CUSTOM PROCEDURE TRAY) ×2 IMPLANT
PAD OB MATERNITY 4.3X12.25 (PERSONAL CARE ITEMS) ×2 IMPLANT
SEAL CERVICAL OMNI LOK (ABLATOR) IMPLANT
SEAL ROD LENS SCOPE MYOSURE (ABLATOR) ×2 IMPLANT
TOWEL OR 17X26 10 PK STRL BLUE (TOWEL DISPOSABLE) ×2 IMPLANT
TRAY DSU PREP LF (CUSTOM PROCEDURE TRAY) ×2 IMPLANT

## 2020-06-10 NOTE — Anesthesia Procedure Notes (Signed)
Procedure Name: LMA Insertion Date/Time: 06/10/2020 1:29 PM Performed by: Rogers Blocker, CRNA Pre-anesthesia Checklist: Patient identified, Emergency Drugs available, Suction available and Patient being monitored Patient Re-evaluated:Patient Re-evaluated prior to induction Oxygen Delivery Method: Circle System Utilized Preoxygenation: Pre-oxygenation with 100% oxygen Induction Type: IV induction Ventilation: Mask ventilation without difficulty LMA: LMA inserted LMA Size: 4.0 Number of attempts: 1 Placement Confirmation: positive ETCO2 Tube secured with: Tape Dental Injury: Teeth and Oropharynx as per pre-operative assessment

## 2020-06-10 NOTE — Discharge Instructions (Signed)
DISCHARGE INSTRUCTIONS: D&C / D&E The following instructions have been prepared to help you care for yourself upon your return home.   Personal hygiene: Marland Kitchen Use sanitary pads for vaginal drainage, not tampons. . Shower the day after your procedure. . NO tub baths, pools or Jacuzzis for 2-3 weeks. . Wipe front to back after using the bathroom.  Activity and limitations: . Do NOT drive or operate any equipment for 24 hours. The effects of anesthesia are still present and drowsiness may result. . Do NOT rest in bed all day. . Walking is encouraged. . Walk up and down stairs slowly. . You may resume your normal activity in one to two days or as indicated by your physician.  Sexual activity: NO intercourse for at least 2 weeks after the procedure, or as indicated by your physician.  Diet: Eat a light meal as desired this evening. You may resume your usual diet tomorrow.  Return to work: You may resume your work activities in one to two days or as indicated by your doctor.  What to expect after your surgery: Expect to have vaginal bleeding/discharge for 2-3 days and spotting for up to 10 days. It is not unusual to have soreness for up to 1-2 weeks. You may have a slight burning sensation when you urinate for the first day. Mild cramps may continue for a couple of days. You may have a regular period in 2-6 weeks.  Call your doctor for any of the following: . Excessive vaginal bleeding, saturating and changing one pad every hour. . Inability to urinate 6 hours after discharge from hospital. . Pain not relieved by pain medication. . Fever of 100.4 F or greater. . Unusual vaginal discharge or odor.    Post Anesthesia Home Care Instructions  Activity: Get plenty of rest for the remainder of the day. A responsible individual must stay with you for 24 hours following the procedure.  For the next 24 hours, DO NOT: -Drive a car -Paediatric nurse -Drink alcoholic beverages -Take any  medication unless instructed by your physician -Make any legal decisions or sign important papers.  Meals: Start with liquid foods such as gelatin or soup. Progress to regular foods as tolerated. Avoid greasy, spicy, heavy foods. If nausea and/or vomiting occur, drink only clear liquids until the nausea and/or vomiting subsides. Call your physician if vomiting continues.  Special Instructions/Symptoms: Your throat may feel dry or sore from the anesthesia or the breathing tube placed in your throat during surgery. If this causes discomfort, gargle with warm salt water. The discomfort should disappear within 24 hours.  If you had a scopolamine patch placed behind your ear for the management of post- operative nausea and/or vomiting:  1. The medication in the patch is effective for 72 hours, after which it should be removed.  Wrap patch in a tissue and discard in the trash. Wash hands thoroughly with soap and water. 2. You may remove the patch earlier than 72 hours if you experience unpleasant side effects which may include dry mouth, dizziness or visual disturbances. 3. Avoid touching the patch. Wash your hands with soap and water after contact with the patch.  No additional Tylenol/acetaminophen until after 7:00 pm tonight if needed. No ibuprofen, Advil, Aleve, Motrin, or naproxen until after 8:00 pm today if needed.

## 2020-06-10 NOTE — H&P (Addendum)
Julie Lee is an 65 y.o. female with 1 yr h/o off and on vaginal bleeding. She had an office biopsy with another doctor last year but was not happy and didnt go back but was told it was not cancer. Bleeding is off and on and worse with movements, she reduced work (cleaning houses) due to that and doesn't move around at home much either.  She is not sexually active.  Nl paps in past.   Office exam noted cervical polyp, sono noted thick EMS at 14 mm and office endometrial biopsy was deferred due to prior experience and plan to do it all today.     Past Medical History:  Diagnosis Date  . Anxiety   . Crohn's colitis ([place]) 2007   followed by dr Ardis Hughs  . History of anemia   . History of kidney stones   . Hyperlipidemia   . Hypertension   . OA (osteoarthritis)   . Pre-diabetes   . Wears glasses     Past Surgical History:  Procedure Laterality Date  . [place]  . COLONOSCOPY  07/05/2019  . TUBAL LIGATION  1980s    Family History  Problem Relation Age of Onset  . Heart disease Father   . Lung cancer Father   . Hyperlipidemia Mother   . Colitis Brother   . Drug abuse Brother   . Colon cancer Maternal Uncle 69  . Diabetes Paternal Uncle   . Diabetes Paternal Grandfather   . Heart disease Paternal Grandfather   . Kidney disease Paternal Uncle   . Heart disease Maternal Grandfather   . Esophageal cancer Neg Hx   . Rectal cancer Neg Hx   . Stomach cancer Neg Hx   . Pancreatic cancer Neg Hx     Social History:  reports that she has never smoked. She has never used smokeless tobacco. She reports previous alcohol use. She reports that she does not use drugs.  Allergies: No Known Allergies  Medications Prior to Admission  Medication Sig Dispense Refill Last Dose  . atenolol (TENORMIN) 50 MG tablet Take 1 tablet (50 mg total) by mouth daily. (Patient taking differently: Take 50 mg by mouth at bedtime.) 90 tablet 1 06/09/2020 at 2000  . atorvastatin (LIPITOR) 10  MG tablet Take 1 tablet (10 mg total) by mouth daily. (Patient taking differently: Take 10 mg by mouth at bedtime.) 90 tablet 3 06/09/2020 at 2000  . balsalazide (COLAZAL) 750 MG capsule Take 3 capsules (2,250 mg total) by mouth 3 (three) times daily. (Patient taking differently: Take 2,250 mg by mouth 3 (three) times daily.) 270 capsule 4 06/09/2020 at 2000  . Calcium Citrate-Vitamin D (CALCIUM + D PO) Take by mouth at bedtime.   06/09/2020 at Unknown time  . esomeprazole (NEXIUM) 20 MG capsule Take 1 capsule (20 mg total) by mouth daily at 12 noon. (Patient taking differently: Take 20 mg by mouth at bedtime.)   06/09/2020 at Unknown time  . lisinopril (ZESTRIL) 20 MG tablet Take 1 tablet (20 mg total) by mouth daily. (Patient taking differently: Take 20 mg by mouth at bedtime.) 90 tablet 1  at 2000  . MAGNESIUM PO Take by mouth at bedtime.   06/09/2020 at Unknown time  . Multiple Vitamins-Minerals (MULTIVITAMIN GUMMIES ADULT) CHEW Chew 1 tablet by mouth daily.   06/09/2020 at Unknown time  . traMADol (ULTRAM) 50 MG tablet TAKE 1 TO 2 TABLETS BY MOUTH EVERY 12 HOURS AS NEEDED 90 tablet 5 06/10/2020 at  0600  . diclofenac Sodium (VOLTAREN) 1 % GEL Apply 2 g topically 4 (four) times daily. (Patient not taking: Reported on 06/09/2020) 100 g 2 Not Taking at Unknown time    Review of Systems bleeding per rectum off and on as well as vag bleeding. Seem to coinside  Blood pressure 139/79, pulse 66, temperature 98.8 F (37.1 C), temperature source Oral, resp. rate 16, height 5' 4"  (1.626 m), weight 79.4 kg, SpO2 96 %. Physical Exam  A&O x 3, no acute distress. Pleasant HEENT neg, no thyromegaly Lungs CTA bilat CV RRR, S1S2 normal Abdo soft, non tender, non acute Extr no edema/ tenderness Pelvic large cervical polyp noted. Uterus AV, normal size. No adnexal masses, no parametrial thickening   Results for orders placed or performed during the hospital encounter of 06/10/20 (from the past 24 hour(s))  CBC     Status:  None   Collection Time: 06/10/20 12:54 PM  Result Value Ref Range   WBC 5.7 4.0 - 10.5 K/uL   RBC 4.57 3.87 - 5.11 MIL/uL   Hemoglobin 12.9 12.0 - 15.0 g/dL   HCT 39.9 36.0 - 46.0 %   MCV 87.3 80.0 - 100.0 fL   MCH 28.2 26.0 - 34.0 pg   MCHC 32.3 30.0 - 36.0 g/dL   RDW 13.8 11.5 - 15.5 %   Platelets 323 150 - 400 K/uL   nRBC 0.0 0.0 - 0.2 %    Assessment/Plan: 65 yo with postmenopausal vaginal bleeding for 1 yr  Cervical polyp and possible endometrial polyp or small fibroid suspected.  Planning D&C, Hysteroscopy, endometrial polypectomy with myosure and cervical polypectomy by excision.  Risks/complications of surgery reviewed incl infection, bleeding, damage to internal organs including bladder, bowels, ureters, blood vessels, other risks from anesthesia, VTE and delayed complications of any surgery, complications in future surgery reviewed. Main concern is risk of perforation with menopausal uterus. D/w pt .gives informed written consent   [name] 06/10/2020, 1:18 PM

## 2020-06-10 NOTE — Anesthesia Postprocedure Evaluation (Signed)
Anesthesia Post Note  Patient: Julie Lee  Procedure(s) Performed: DILATATION & CURETTAGE/HYSTEROSCOPY/Polypectomy WITH MYOSURE And Cervical Polypectomy/Resection of Endometrial Polyp (N/A Vagina )     Patient location during evaluation: Phase II Anesthesia Type: General Level of consciousness: awake and alert, patient cooperative and oriented Pain management: pain level controlled Vital Signs Assessment: post-procedure vital signs reviewed and stable Respiratory status: spontaneous breathing, nonlabored ventilation and respiratory function stable Cardiovascular status: stable and blood pressure returned to baseline Postop Assessment: no apparent nausea or vomiting, able to ambulate and adequate PO intake Anesthetic complications: no   No complications documented.  Last Vitals:  Vitals:   06/10/20 1430 06/10/20 1500  BP: 123/69 135/69  Pulse: 72 68  Resp: 10 12  Temp: 36.4 C 36.7 C  SpO2: 95% 97%    Last Pain:  Vitals:   06/10/20 1444  TempSrc:   PainSc: 6                  Jaleia Hanke,E. Cythia Bachtel

## 2020-06-10 NOTE — Transfer of Care (Signed)
Immediate Anesthesia Transfer of Care Note  Patient: Julie Lee  Procedure(s) Performed: DILATATION & CURETTAGE/HYSTEROSCOPY/Polypectomy WITH MYOSURE And Cervical Polypectomy/Resection of Endometrial Polyp (N/A Vagina )  Patient Location: PACU  Anesthesia Type:General  Level of Consciousness: awake, alert , oriented and patient cooperative  Airway & Oxygen Therapy: Patient Spontanous Breathing and Patient connected to nasal cannula oxygen  Post-op Assessment: Report given to RN and Post -op Vital signs reviewed and stable  Post vital signs: Reviewed and stable  Last Vitals:  Vitals Value Taken Time  BP 121/53 06/10/20 1408  Temp 36.9 C 06/10/20 1408  Pulse 82 06/10/20 1410  Resp 19 06/10/20 1410  SpO2 100 % 06/10/20 1410  Vitals shown include unvalidated device data.  Last Pain:  Vitals:   06/10/20 1227  TempSrc: Oral  PainSc: 4       Patients Stated Pain Goal: 4 (00/45/99 7741)  Complications: No complications documented.

## 2020-06-10 NOTE — Op Note (Signed)
Preoperative diagnosis: Postmenopausal bleeding, cervical polyp, thick endometrial stripe, suspect endometrial polyps.  Postop diagnosis: as above.  Procedure: Hysteroscopic endometrial polypectomy with Myosure Reach. D&C, cervical polypectomy  Anesthesia General via LMA  Surgeon: Azucena Fallen, MD  Assistant: none IV fluids 800 cc LR Estimated blood loss 10 cc Urine output: straight catheter preop  50 cc preop Complications none  Condition stable  Disposition PACU  Specimen: Cervical polyp, endometrial polyp (morcellated) and endometrial curettings   Procedure  Indication: Postmenopausal bleeding. Cervical polyp and thick endometrial stripe noted. Patient was counseled on risks/ complications including infection, bleeding, damage to internal organs, she understood and agrees, gave informed written consent.  Patient was brought to the operating room with IV running. Time out was carried out. She received preop 2 gm Ancef. She underwent general anesthesia via LMA without complications. She was given dorsolithotomy position. Parts were prepped and draped in standard fashion. Bladder was catheterized once. Bimanual exam revealed uterus to be anteverted and normal size. Speculum was placed and cervix was grasped with single-tooth tenaculum. Cervical block with 20 cc 1% Xylocaine with epinephrine given. The uterus was sounded to 7 cm. Cervical os was alreay dilated. 3 cm cervical polyp, possible fibroid was first twisted off and handed of for pathology. Cervix itself was normal. Myosure Reach hysteroscope was introduced in the uterine cavity under vision, using saline for irrigation. Multiple endometrial polyps noted. Both tubal ostii see well and fundus noted to be normal.  Hysteroscopic polypectomy was performed with Myosure reach under vision. Specimen sent to path. Hysteroscope was removed. Endometrial curettage was performed with sharp curette. All tissue sent to path.  Fluid deficit 200 cc.  All  counts are correct x2. No complications. Patient brought to the recovery room in stable condition.  I performed this surgery.  --Brittnae Aschenbrenner.Benjie Karvonen, MD.

## 2020-06-10 NOTE — Anesthesia Preprocedure Evaluation (Addendum)
Anesthesia Evaluation  Patient identified by MRN, date of birth, ID band Patient awake    Reviewed: Allergy & Precautions, NPO status , Patient's Chart, lab work & pertinent test results, reviewed documented beta blocker date and time   History of Anesthesia Complications Negative for: history of anesthetic complications  Airway Mallampati: II  TM Distance: >3 FB Neck ROM: Full    Dental  (+) Implants, Dental Advisory Given   Pulmonary neg pulmonary ROS,  06/09/2020 SARS coronavirus NEG   breath sounds clear to auscultation       Cardiovascular hypertension, Pt. on medications and Pt. on home beta blockers (-) angina Rhythm:Regular Rate:Normal     Neuro/Psych Anxiety negative neurological ROS     GI/Hepatic Neg liver ROS, PUD, GERD  Medicated and Controlled,  Endo/Other  obese  Renal/GU negative Renal ROS     Musculoskeletal  (+) Arthritis  (Tramadol), Osteoarthritis,    Abdominal (+) + obese,   Peds  Hematology negative hematology ROS (+)   Anesthesia Other Findings   Reproductive/Obstetrics                            Anesthesia Physical Anesthesia Plan  ASA: II  Anesthesia Plan: General   Post-op Pain Management:    Induction: Intravenous  PONV Risk Score and Plan: 3 and Ondansetron, Dexamethasone and Scopolamine patch - Pre-op  Airway Management Planned: LMA  Additional Equipment: None  Intra-op Plan:   Post-operative Plan:   Informed Consent: I have reviewed the patients History and Physical, chart, labs and discussed the procedure including the risks, benefits and alternatives for the proposed anesthesia with the patient or authorized representative who has indicated his/her understanding and acceptance.     Dental advisory given  Plan Discussed with: CRNA and Surgeon  Anesthesia Plan Comments:        Anesthesia Quick Evaluation

## 2020-06-11 ENCOUNTER — Encounter (HOSPITAL_BASED_OUTPATIENT_CLINIC_OR_DEPARTMENT_OTHER): Payer: Self-pay | Admitting: Obstetrics & Gynecology

## 2020-06-11 LAB — SURGICAL PATHOLOGY

## 2020-07-31 ENCOUNTER — Other Ambulatory Visit: Payer: Self-pay | Admitting: Physician Assistant

## 2020-07-31 ENCOUNTER — Ambulatory Visit
Admission: RE | Admit: 2020-07-31 | Discharge: 2020-07-31 | Disposition: A | Discharge status: Home / Self Care | Payer: Medicare Other | Source: Ambulatory Visit | Attending: Physician Assistant | Admitting: Physician Assistant

## 2020-07-31 ENCOUNTER — Other Ambulatory Visit: Payer: Self-pay

## 2020-07-31 DIAGNOSIS — M25552 Pain in left hip: Secondary | ICD-10-CM

## 2020-08-31 ENCOUNTER — Encounter: Payer: Self-pay | Admitting: Gastroenterology

## 2020-08-31 ENCOUNTER — Other Ambulatory Visit (INDEPENDENT_AMBULATORY_CARE_PROVIDER_SITE_OTHER): Payer: Medicare Other

## 2020-08-31 ENCOUNTER — Ambulatory Visit: Payer: Medicare Other | Admitting: Gastroenterology

## 2020-08-31 VITALS — BP 154/80 | HR 68 | Ht 63.0 in | Wt 173.4 lb

## 2020-08-31 DIAGNOSIS — K50119 Crohn's disease of large intestine with unspecified complications: Secondary | ICD-10-CM

## 2020-08-31 LAB — CBC WITH DIFFERENTIAL/PLATELET
Basophils Absolute: 0.1 10*3/uL (ref 0.0–0.1)
Basophils Relative: 0.9 % (ref 0.0–3.0)
Eosinophils Absolute: 0.2 10*3/uL (ref 0.0–0.7)
Eosinophils Relative: 2.8 % (ref 0.0–5.0)
HCT: 40.8 % (ref 36.0–46.0)
Hemoglobin: 13.4 g/dL (ref 12.0–15.0)
Lymphocytes Relative: 31.7 % (ref 12.0–46.0)
Lymphs Abs: 2 10*3/uL (ref 0.7–4.0)
MCHC: 32.8 g/dL (ref 30.0–36.0)
MCV: 85.6 fl (ref 78.0–100.0)
Monocytes Absolute: 0.5 10*3/uL (ref 0.1–1.0)
Monocytes Relative: 8 % (ref 3.0–12.0)
Neutro Abs: 3.6 10*3/uL (ref 1.4–7.7)
Neutrophils Relative %: 56.6 % (ref 43.0–77.0)
Platelets: 316 10*3/uL (ref 150.0–400.0)
RBC: 4.77 Mil/uL (ref 3.87–5.11)
RDW: 13.5 % (ref 11.5–15.5)
WBC: 6.4 10*3/uL (ref 4.0–10.5)

## 2020-08-31 LAB — COMPREHENSIVE METABOLIC PANEL
ALT: 16 U/L (ref 0–35)
AST: 18 U/L (ref 0–37)
Albumin: 4.4 g/dL (ref 3.5–5.2)
Alkaline Phosphatase: 73 U/L (ref 39–117)
BUN: 14 mg/dL (ref 6–23)
CO2: 26 mEq/L (ref 19–32)
Calcium: 9.7 mg/dL (ref 8.4–10.5)
Chloride: 105 mEq/L (ref 96–112)
Creatinine, Ser: 0.76 mg/dL (ref 0.40–1.20)
GFR: 81.1 mL/min (ref >60.00)
Glucose, Bld: 83 mg/dL (ref 70–99)
Potassium: 4.4 mEq/L (ref 3.5–5.1)
Sodium: 139 mEq/L (ref 135–145)
Total Bilirubin: 0.3 mg/dL (ref 0.2–1.2)
Total Protein: 7.3 g/dL (ref 6.0–8.3)

## 2020-08-31 LAB — C-REACTIVE PROTEIN: CRP: <1 mg/dL (ref 0.5–20.0)

## 2020-08-31 LAB — SEDIMENTATION RATE: Sed Rate: 33 mm/hr — ABNORMAL HIGH (ref 0–30)

## 2020-08-31 NOTE — Patient Instructions (Signed)
Your provider has requested that you go to the basement level for lab work before leaving today. Press "B" on the elevator. The lab is located at the first door on the left as you exit the elevator.  Please try to take all 9 of your Colazal tables every day and not skip any doses.   Due to recent changes in healthcare laws, you may see the results of your imaging and laboratory studies on MyChart before your provider has had a chance to review them.  We understand that in some cases there may be results that are confusing or concerning to you. Not all laboratory results come back in the same time frame and the provider may be waiting for multiple results in order to interpret others.  Please give [place] 48 hours in order for your provider to thoroughly review all the results before contacting the office for clarification of your results.   The [facility] providers would like to encourage you to use [facility] to communicate with providers for non-urgent requests or questions.  Due to long hold times on the telephone, sending your provider a message by [facility] may be a faster and more efficient way to get a response.  Please allow 48 business hours for a response.  Please remember that this is for non-urgent requests.   Normal BMI (Body Mass Index- based on height and weight) is between 23 and 30. Your BMI today is Body mass index is 30.72 kg/m. Marland Kitchen Please consider follow up  regarding your BMI with your Primary Care Provider.

## 2020-08-31 NOTE — Progress Notes (Signed)
Review of pertinent gastrointestinal problems: 1.  Eosinophilic esophagitis, responsive to proton pump inhibitor  Upper endoscopy, June 2007, Dr. Richardson Landry fine, done for abdominal pain, nausea vomiting, dysphagia, diarrhea. Findings scattered erosions in the antrum, normal otherwise. She was recommended to stop diclofenac. 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 Biopsies of the duodenum were normal.  EGD June 2015 Dr. Ardis Hughs was normal, biopsies were taken to check for eosinophilic esophagitis.  Biopsies again suggestive of eosinophilic changes.  She was recommended to start swallowed fluticasone twice daily.  Told to follow-up in 6 or 7 weeks but never did.  June 2021: No dysphagia on the Nexium once daily.   2.  Crohn's colitis colonoscopy, June 2007, Dr. Richardson Landry fine, done for diarrhea, rectal bleeding, occult GI bleeding. Findings diverticulosis of the sigmoid, ulcers in the transverse colon descending colon and cecum which were described as multiple shallow nonbleeding ulcers ranging in size from 2-4 millimeters associated with mucosal friability, normal terminal ileum, internal hemorrhoids. Biopsies suggested that she had mild, chronic, active colitis this was in transverse, cecum and descending segments.  Colonoscopy June 2015 Dr. Ardis Hughs showed normal terminal ileum.  Mild erythema in the ascending segment.  Biopsies suggested chronic inflammation.  She was recommended to stay on Lialda twice daily and follow-up in 6 to 7 weeks.  Never followed up until 2021.  Repeat colonoscopy Dr. Ardis Hughs April 2021 showed normal terminal ileum.  There was a 2 mm polyp removed from the cecum.  The mucosa in the a sending was slightly inflamed appearing.  Mucosa throughout the transverse, descending, sigmoid, rectum were all normal.  Multiple biopsies taken throughout.  Pathology showed mild active inflammation at the polyp site only.  She was recommended continue colazoll 3 pills 3 times  daily   HPI: This is a very pleasant 65 year old woman   I last saw her almost exactly 1 year ago for Crohn's colitis.  She is here today for follow-up visit.  At the time of her visit 1 year ago her symptoms were under good control as long she was taking balsalazide 750 mg 3 times daily.  Blood work April 2022 showed normal CBC and normal basic metabolic profile  She underwent a D&C early April of this year for postmenopausal bleeding.  She did not get antibiotics around that procedure that she is aware of  For the past 2 weeks she has noticed a change in her bowels and some mild abdominal discomforts.  Previously she was having 1-2 solid bowel movements every morning.  In the past 2 weeks she has noticed increased frequency up to 3 or 4 times a day with looser stools periodically blood-tinged.  She has had to get up in the middle the night a couple times.  She admits that she probably does not get all of her colas all in every day.  She estimates she probably takes 5-6 colazol pills out of the full dose regimen of 9/day.  She has had no fevers or chills   ROS: complete GI ROS as described in HPI, all other review negative.  Constitutional:  No unintentional weight loss   Past Medical History:  Diagnosis Date   Anxiety    Crohn's colitis ([place]) 2007   followed by dr Ardis Hughs   History of anemia    History of kidney stones    Hyperlipidemia    Hypertension    OA (osteoarthritis)    Pre-diabetes    Wears glasses     Past Surgical  History:  Procedure Laterality Date   CESAREAN SECTION  1977   COLONOSCOPY  07/05/2019   DILATATION & CURETTAGE/HYSTEROSCOPY WITH MYOSURE N/A 06/10/2020   Procedure: DILATATION & CURETTAGE/HYSTEROSCOPY/Polypectomy WITH MYOSURE And Cervical Polypectomy/Resection of Endometrial Polyp;  Surgeon: Azucena Fallen, MD;  Location: [facility];  Service: Gynecology;  Laterality: N/A;  1hr.   TUBAL LIGATION  1980s    Current Outpatient  Medications  Medication Sig Dispense Refill   atenolol (TENORMIN) 50 MG tablet Take 1 tablet (50 mg total) by mouth daily. (Patient taking differently: Take 50 mg by mouth at bedtime.) 90 tablet 1   atorvastatin (LIPITOR) 10 MG tablet Take 1 tablet (10 mg total) by mouth daily. (Patient taking differently: Take 10 mg by mouth at bedtime.) 90 tablet 3   balsalazide (COLAZAL) 750 MG capsule Take 3 capsules (2,250 mg total) by mouth 3 (three) times daily. (Patient taking differently: Take 2,250 mg by mouth 3 (three) times daily.) 270 capsule 4   Calcium Citrate-Vitamin D (CALCIUM + D PO) Take by mouth at bedtime.     diazepam (VALIUM) 10 MG tablet Take 10 mg by mouth every 6 (six) hours as needed for anxiety.     diclofenac Sodium (VOLTAREN) 1 % GEL Apply 2 g topically 4 (four) times daily. (Patient taking differently: Apply 2 g topically 4 (four) times daily. As needed) 100 g 2   esomeprazole (NEXIUM) 20 MG capsule Take 1 capsule (20 mg total) by mouth daily at 12 noon. (Patient taking differently: Take 20 mg by mouth at bedtime.)     lisinopril (ZESTRIL) 20 MG tablet Take 1 tablet (20 mg total) by mouth daily. (Patient taking differently: Take 20 mg by mouth at bedtime.) 90 tablet 1   MAGNESIUM PO Take by mouth at bedtime.     Multiple Vitamins-Minerals (MULTIVITAMIN GUMMIES ADULT) CHEW Chew 1 tablet by mouth daily.     traMADol (ULTRAM) 50 MG tablet TAKE 1 TO 2 TABLETS BY MOUTH EVERY 12 HOURS AS NEEDED 90 tablet 5   No current facility-administered medications for this visit.    Allergies as of 08/31/2020   (No Known Allergies)    Family History  Problem Relation Age of Onset   Heart disease Father    Lung cancer Father    Hyperlipidemia Mother    Colitis Brother    Drug abuse Brother    Colon cancer Maternal Uncle 77   Diabetes Paternal Uncle    Diabetes Paternal Grandfather    Heart disease Paternal Grandfather    Kidney disease Paternal Uncle    Heart disease Maternal Grandfather     Esophageal cancer Neg Hx    Rectal cancer Neg Hx    Stomach cancer Neg Hx    Pancreatic cancer Neg Hx     Social History   Socioeconomic History   Marital status: Single    Spouse name: Not on file   Number of children: Not on file   Years of education: Not on file   Highest education level: Not on file  Occupational History   Occupation: selfemployed  Tobacco Use   Smoking status: Never   Smokeless tobacco: Never  Vaping Use   Vaping Use: Never used  Substance and Sexual Activity   Alcohol use: Not Currently    Comment: rarely   Drug use: Never   Sexual activity: Not Currently    Birth control/protection: Post-menopausal  Other Topics Concern   Not on file  Social History Narrative   Lives  alone   Good relationship with son and granddaughter   Social Determinants of Health   Financial Resource Strain: Not on file  Food Insecurity: Not on file  Transportation Needs: Not on file  Physical Activity: Not on file  Stress: Not on file  Social Connections: Not on file  Intimate Partner Violence: Not on file     Physical Exam: BP (!) 154/80   Pulse 68   Ht 5' 3"  (1.6 m)   Wt 173 lb 6.4 oz (78.7 kg)   BMI 30.72 kg/m  Constitutional: generally well-appearing Psychiatric: alert and oriented x3 Abdomen: soft, nontender, nondistended, no obvious ascites, no peritoneal signs, normal bowel sounds No peripheral edema noted in lower extremities  Assessment and plan: 65 y.o. female with Crohn's colitis, possible flare  Certainly seems like she might be having a flare of her colitis.  I also bit surprised that she would not have received periprocedural antibiotics around the time of her D&C.  Regardless I recommended stool testing and blood work to try to confirm flare or at least rule out infection as best that we can.  This would include GI pathogen panel, fecal calprotectin, CBC, complete metabolic profile, inflammatory markers.  If no clear infection is discovered  then I will start her on a relatively short course of prednisone.  We will set up return office visit with me in 6 weeks or so from now.  She knows to call sooner if she has having worsening symptoms.  Please see the "Patient Instructions" section for addition details about the plan.  Owens Loffler, MD [facility] 08/31/2020, 11:14 AM   Total time on date of encounter was 35 minutes (this included time spent preparing to see the patient reviewing records; obtaining and/or reviewing separately obtained history; performing a medically appropriate exam and/or evaluation; counseling and educating the patient and family if present; ordering medications, tests or procedures if applicable; and documenting clinical information in the health record).

## 2020-09-01 ENCOUNTER — Ambulatory Visit
Admission: RE | Admit: 2020-09-01 | Discharge: 2020-09-01 | Disposition: A | Discharge status: Home / Self Care | Payer: Medicare Other | Source: Ambulatory Visit | Attending: Physician Assistant | Admitting: Physician Assistant

## 2020-09-01 ENCOUNTER — Other Ambulatory Visit: Payer: Self-pay | Admitting: Physician Assistant

## 2020-09-01 ENCOUNTER — Other Ambulatory Visit: Payer: Self-pay

## 2020-09-01 DIAGNOSIS — M545 Low back pain, unspecified: Secondary | ICD-10-CM

## 2020-09-03 ENCOUNTER — Other Ambulatory Visit: Payer: Medicare Other

## 2020-09-03 DIAGNOSIS — K50119 Crohn's disease of large intestine with unspecified complications: Secondary | ICD-10-CM

## 2020-09-09 LAB — GI PROFILE, STOOL, PCR

## 2020-09-09 LAB — CALPROTECTIN, FECAL: Calprotectin, Fecal: 151 ug/g — ABNORMAL HIGH (ref 0–120)

## 2020-09-11 ENCOUNTER — Other Ambulatory Visit: Payer: Self-pay

## 2020-09-11 MED ORDER — PREDNISONE 10 MG PO TABS: ORAL_TABLET | ORAL | 0 refills | Status: AC

## 2020-10-20 ENCOUNTER — Ambulatory Visit: Payer: Medicare Other | Admitting: Gastroenterology

## 2020-12-23 ENCOUNTER — Encounter: Payer: Self-pay | Admitting: Gastroenterology

## 2020-12-23 ENCOUNTER — Ambulatory Visit: Payer: Medicare Other | Admitting: Gastroenterology

## 2020-12-23 VITALS — BP 138/80 | HR 70 | Ht 63.5 in | Wt 178.0 lb

## 2020-12-23 DIAGNOSIS — K50119 Crohn's disease of large intestine with unspecified complications: Secondary | ICD-10-CM | POA: Diagnosis not present

## 2020-12-23 MED ORDER — BALSALAZIDE DISODIUM 750 MG PO CAPS
2250.0000 mg | ORAL_CAPSULE | Freq: Three times a day (TID) | ORAL | 3 refills | Status: DC
Start: 2020-12-23 — End: 2022-01-19

## 2020-12-23 NOTE — Progress Notes (Signed)
Review of pertinent gastrointestinal problems: 1.  Eosinophilic esophagitis, responsive to proton pump inhibitor  Upper endoscopy, June 2007, Dr. Richardson Landry fine, done for abdominal pain, nausea vomiting, dysphagia, diarrhea. Findings scattered erosions in the antrum, normal otherwise. She was recommended to stop diclofenac. 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 Biopsies of the duodenum were normal.  EGD June 2015 Dr. Ardis Hughs was normal, biopsies were taken to check for eosinophilic esophagitis.  Biopsies again suggestive of eosinophilic changes.  She was recommended to start swallowed fluticasone twice daily.  Told to follow-up in 6 or 7 weeks but never did.  June 2021: No dysphagia on the Nexium once daily.   2.  Crohn's colitis colonoscopy, June 2007, Dr. Elvis Coil, done for diarrhea, rectal bleeding, occult GI bleeding. Findings diverticulosis of the sigmoid, ulcers in the transverse colon descending colon and cecum which were described as multiple shallow nonbleeding ulcers ranging in size from 2-4 millimeters associated with mucosal friability, normal terminal ileum, internal hemorrhoids. Biopsies suggested that she had mild, chronic, active colitis this was in transverse, cecum and descending segments.  Colonoscopy June 2015 Dr. Ardis Hughs showed normal terminal ileum.  Mild erythema in the ascending segment.  Biopsies suggested chronic inflammation.  She was recommended to stay on Lialda twice daily and follow-up in 6 to 7 weeks.  Never followed up until 2021.  Repeat colonoscopy Dr. Ardis Hughs April 2021 showed normal terminal ileum.  There was a 2 mm polyp removed from the cecum.  The mucosa in the a sending was slightly inflamed appearing.  Mucosa throughout the transverse, descending, sigmoid, rectum were all normal.  Multiple biopsies taken throughout.  Pathology showed mild active inflammation at the polyp site only.  She was recommended continue colazoll 3 pills 3 times  daily.  Flare of symptoms 2022, stool testing and blood work supported this.  Prednisone 20 mg with about a 3-week taper significantly improved her symptoms.   HPI: This is a very pleasant 65 year old woman   I saw her about 4 months ago here in the office.  For the 2 weeks prior to that visit she was having looser than usual stools that were a bit blood-tinged.  She was also having some nocturnal loose stools.  Seem like she was probably having a mild flare.  I did a battery of blood work as well as stool testing and that also important minor flare and so I recommended prednisone pulse with a quick taper starting at 20 mg/day for 10 days and tapering by 5 mg/week after that.  She admitted she was not great about taking all of her colas all pills on an every day basis.  She will generally take 5 or 6 pills out of 9  Her weight is up 5 pounds since that visit 4 months ago.  Same scale here in our office  She tells me today that the prednisone pulse and taper significantly improved her flare symptoms so that she is back to normal and has been for at least 2 to 3 months now.  She has 1 bowel movement twice daily.  Not bleeding, no abdominal pains.  She takes colazol 750 mg pills 2 pills 3 times daily and sometimes she will take 1/3 pill at night.   ROS: complete GI ROS as described in HPI, all other review negative.  Constitutional:  No unintentional weight loss   Past Medical History:  Diagnosis Date   Anxiety    Crohn's colitis ([place]) 2007   followed by  dr Ardis Hughs   History of anemia    History of kidney stones    Hyperlipidemia    Hypertension    OA (osteoarthritis)    Pre-diabetes    Wears glasses     Past Surgical History:  Procedure Laterality Date   CESAREAN SECTION  1977   COLONOSCOPY  07/05/2019   DILATATION & CURETTAGE/HYSTEROSCOPY WITH MYOSURE N/A 06/10/2020   Procedure: DILATATION & CURETTAGE/HYSTEROSCOPY/Polypectomy WITH MYOSURE And Cervical Polypectomy/Resection of  Endometrial Polyp;  Surgeon: Azucena Fallen, MD;  Location: [facility];  Service: Gynecology;  Laterality: N/A;  1hr.   TUBAL LIGATION  1980s    Current Outpatient Medications  Medication Sig Dispense Refill   atenolol (TENORMIN) 50 MG tablet Take 1 tablet (50 mg total) by mouth daily. (Patient taking differently: Take 50 mg by mouth at bedtime.) 90 tablet 1   atorvastatin (LIPITOR) 10 MG tablet Take 1 tablet (10 mg total) by mouth daily. (Patient taking differently: Take 10 mg by mouth at bedtime.) 90 tablet 3   balsalazide (COLAZAL) 750 MG capsule Take 3 capsules (2,250 mg total) by mouth 3 (three) times daily. (Patient taking differently: Take 2,250 mg by mouth 3 (three) times daily.) 270 capsule 4   Calcium Citrate-Vitamin D (CALCIUM + D PO) Take by mouth at bedtime.     diazepam (VALIUM) 10 MG tablet Take 10 mg by mouth every 6 (six) hours as needed for anxiety.     diclofenac Sodium (VOLTAREN) 1 % GEL Apply 2 g topically 4 (four) times daily. (Patient taking differently: Apply 2 g topically 4 (four) times daily. As needed) 100 g 2   esomeprazole (NEXIUM) 20 MG capsule Take 1 capsule (20 mg total) by mouth daily at 12 noon. (Patient taking differently: Take 20 mg by mouth at bedtime.)     lisinopril (ZESTRIL) 20 MG tablet Take 1 tablet (20 mg total) by mouth daily. (Patient taking differently: Take 40 mg by mouth at bedtime.) 90 tablet 1   MAGNESIUM PO Take by mouth at bedtime.     Multiple Vitamins-Minerals (MULTIVITAMIN GUMMIES ADULT) CHEW Chew 1 tablet by mouth daily.     traMADol (ULTRAM) 50 MG tablet TAKE 1 TO 2 TABLETS BY MOUTH EVERY 12 HOURS AS NEEDED 90 tablet 5   No current facility-administered medications for this visit.    Allergies as of 12/23/2020   (No Known Allergies)    Family History  Problem Relation Age of Onset   Heart disease Father    Lung cancer Father    Hyperlipidemia Mother    Colitis Brother    Drug abuse Brother    Colon cancer  Maternal Uncle 51   Diabetes Paternal Uncle    Diabetes Paternal Grandfather    Heart disease Paternal Grandfather    Kidney disease Paternal Uncle    Heart disease Maternal Grandfather    Esophageal cancer Neg Hx    Rectal cancer Neg Hx    Stomach cancer Neg Hx    Pancreatic cancer Neg Hx     Social History   Socioeconomic History   Marital status: Single    Spouse name: Not on file   Number of children: Not on file   Years of education: Not on file   Highest education level: Not on file  Occupational History   Occupation: selfemployed  Tobacco Use   Smoking status: Never   Smokeless tobacco: Never  Vaping Use   Vaping Use: Never used  Substance and Sexual Activity  Alcohol use: Not Currently    Comment: rarely   Drug use: Never   Sexual activity: Not Currently    Birth control/protection: Post-menopausal  Other Topics Concern   Not on file  Social History Narrative   Lives alone   Good relationship with son and granddaughter   Social Determinants of Health   Financial Resource Strain: Not on file  Food Insecurity: Not on file  Transportation Needs: Not on file  Physical Activity: Not on file  Stress: Not on file  Social Connections: Not on file  Intimate Partner Violence: Not on file     Physical Exam: BP 138/80   Pulse 70   Ht 5' 3.5" (1.613 m)   Wt 178 lb (80.7 kg)   BMI 31.04 kg/m  Constitutional: generally well-appearing Psychiatric: alert and oriented x3 Abdomen: soft, nontender, nondistended, no obvious ascites, no peritoneal signs, normal bowel sounds No peripheral edema noted in lower extremities  Assessment and plan: 65 y.o. female with Crohn's colitis  Her symptoms are under very good control on colas all generally 6 pills daily.  I recommended that she consider taking full-strength which would be 3 pills 3 times daily.  She will consider that.  She is doing quite well.  She will return to see me in 1 year and sooner if needed.  I am  refilling her colas all at 3 times daily dosing today.  Please see the "Patient Instructions" section for addition details about the plan.  Owens Loffler, MD [facility] 12/23/2020, 3:22 PM   Total time on date of encounter was 30 minutes (this included time spent preparing to see the patient reviewing records; obtaining and/or reviewing separately obtained history; performing a medically appropriate exam and/or evaluation; counseling and educating the patient and family if present; ordering medications, tests or procedures if applicable; and documenting clinical information in the health record).

## 2020-12-23 NOTE — Patient Instructions (Addendum)
If you are age 67 or younger, your body mass index should be between 19-25. Your Body mass index is 31.04 kg/m. If this is out of the aformentioned range listed, please consider follow up with your Primary Care Provider.   ________________________________________________________  The [facility] providers would like to encourage you to use [facility] to communicate with providers for non-urgent requests or questions.  Due to long hold times on the telephone, sending your provider a message by [facility] may be a faster and more efficient way to get a response.  Please allow 48 business hours for a response.  Please remember that this is for non-urgent requests.  _______________________________________________________  We have sent the following medications to your pharmacy for you to pick up at your convenience:  Danielson will need to follow up in our office in 1 year.  We will contact you to schedule your appointment.  Thank you for entrusting me with your care and choosing [facility].  Dr Ardis Hughs

## 2021-12-21 ENCOUNTER — Ambulatory Visit: Payer: Medicare Other | Admitting: Gastroenterology

## 2021-12-29 ENCOUNTER — Ambulatory Visit: Payer: Medicare Other | Admitting: Gastroenterology

## 2022-01-19 ENCOUNTER — Ambulatory Visit: Payer: Medicare Other | Admitting: Gastroenterology

## 2022-01-19 ENCOUNTER — Encounter: Payer: Self-pay | Admitting: Gastroenterology

## 2022-01-19 VITALS — BP 140/72 | HR 67 | Ht 63.0 in | Wt 177.6 lb

## 2022-01-19 DIAGNOSIS — K200 Eosinophilic esophagitis: Secondary | ICD-10-CM

## 2022-01-19 DIAGNOSIS — K219 Gastro-esophageal reflux disease without esophagitis: Secondary | ICD-10-CM | POA: Insufficient documentation

## 2022-01-19 DIAGNOSIS — K50119 Crohn's disease of large intestine with unspecified complications: Secondary | ICD-10-CM | POA: Insufficient documentation

## 2022-01-19 MED ORDER — BALSALAZIDE DISODIUM 750 MG PO CAPS: 2250.0000 mg | ORAL_CAPSULE | Freq: Three times a day (TID) | ORAL | 3 refills | Status: DC

## 2022-01-19 MED ORDER — ESOMEPRAZOLE MAGNESIUM 20 MG PO CPDR: 20.0000 mg | DELAYED_RELEASE_CAPSULE | Freq: Every day | ORAL | 3 refills | Status: DC

## 2022-01-19 NOTE — Patient Instructions (Signed)
We have sent the following medications to your pharmacy for you to pick up at your convenience: Colazal and Nexium.  Please have your primary care physician fax your upcoming lab work results to [phone].  The  GI providers would like to encourage you to use [facility] to communicate with providers for non-urgent requests or questions.  Due to long hold times on the telephone, sending your provider a message by [facility] may be a faster and more efficient way to get a response.  Please allow 48 business hours for a response.  Please remember that this is for non-urgent requests.

## 2022-01-19 NOTE — Progress Notes (Signed)
01/19/2022 Julie Lee [id number] 17-Sep-1955   Review of pertinent gastrointestinal problems: 1.  Eosinophilic esophagitis, responsive to proton pump inhibitor  Upper endoscopy, June 2007, Dr. Richardson Landry fine, done for abdominal pain, nausea vomiting, dysphagia, diarrhea. Findings scattered erosions in the antrum, normal otherwise. She was recommended to stop diclofenac. 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 Biopsies of the duodenum were normal.  EGD June 2015 Dr. Ardis Hughs was normal, biopsies were taken to check for eosinophilic esophagitis.  Biopsies again suggestive of eosinophilic changes.  She was recommended to start swallowed fluticasone twice daily.  Told to follow-up in 6 or 7 weeks but never did.  June 2021: No dysphagia on the Nexium once daily.  2.  Crohn's colitis colonoscopy, June 2007, Dr. Elvis Coil, done for diarrhea, rectal bleeding, occult GI bleeding. Findings diverticulosis of the sigmoid, ulcers in the transverse colon descending colon and cecum which were described as multiple shallow nonbleeding ulcers ranging in size from 2-4 millimeters associated with mucosal friability, normal terminal ileum, internal hemorrhoids. Biopsies suggested that she had mild, chronic, active colitis this was in transverse, cecum and descending segments.  Colonoscopy June 2015 Dr. Ardis Hughs showed normal terminal ileum.  Mild erythema in the ascending segment.  Biopsies suggested chronic inflammation.  She was recommended to stay on Lialda twice daily and follow-up in 6 to 7 weeks.  Never followed up until 2021.  Says that Lialda caused hair loss so that was stopped.  Repeat colonoscopy Dr. Ardis Hughs April 2021 showed normal terminal ileum.  There was a 2 mm polyp removed from the cecum.  The mucosa in the a sending was slightly inflamed appearing.  Mucosa throughout the transverse, descending, sigmoid, rectum were all normal.  Multiple biopsies taken throughout.   Pathology showed mild active inflammation at the polyp site only.  She was recommended continue colazoll 3 pills 3 times daily.  Flare of symptoms 2022, stool testing and blood work supported this.  Prednisone 20 mg with about a 3-week taper significantly improved her symptoms.    HISTORY OF PRESENT ILLNESS: This is a 66 year old female who is a patient of Dr. Ardis Hughs who follows here for her GERD/EOE and her mild Crohn's colitis.  In reguards to her EOE she is only on Nexium 20 mg daily and does well with that, has no complaints.  She gets that over-the-counter but would like to see what the cost is with the prescription.  In regards to her Crohn's colitis she is on colazal 3 pills 3 times daily and is doing well.  No flares since June 2022.  She has regular bowel movements, no abdominal pain or rectal bleeding.  She is having labs done in the near future by her PCP so we will see if she can get those results sent over to [place] as well.  Needs her medications refilled.  Past Medical History:  Diagnosis Date   Anxiety    Crohn's colitis ([place]) 2007   followed by dr Ardis Hughs   History of anemia    History of kidney stones    Hyperlipidemia    Hypertension    OA (osteoarthritis)    Pre-diabetes    Wears glasses    Past Surgical History:  Procedure Laterality Date   CESAREAN SECTION  1977   COLONOSCOPY  07/05/2019   DILATATION & CURETTAGE/HYSTEROSCOPY WITH MYOSURE N/A 06/10/2020   Procedure: DILATATION & CURETTAGE/HYSTEROSCOPY/Polypectomy WITH MYOSURE And Cervical Polypectomy/Resection of Endometrial Polyp;  Surgeon: Azucena Fallen, MD;  Location:  [facility];  Service: Gynecology;  Laterality: N/A;  1hr.   TUBAL LIGATION  1980s    reports that she has never smoked. She has never used smokeless tobacco. She reports that she does not currently use alcohol. She reports that she does not use drugs. family history includes Colitis in her brother; Colon cancer (age of onset: 30) in her  maternal uncle; Diabetes in her paternal grandfather and paternal uncle; Drug abuse in her brother; Heart disease in her father, maternal grandfather, and paternal grandfather; Hyperlipidemia in her mother; Kidney disease in her paternal uncle; Lung cancer in her father. No Known Allergies    Outpatient Encounter Medications as of 01/19/2022  Medication Sig   atenolol (TENORMIN) 50 MG tablet Take 1 tablet (50 mg total) by mouth daily. (Patient taking differently: Take 50 mg by mouth at bedtime.)   atorvastatin (LIPITOR) 10 MG tablet Take 1 tablet (10 mg total) by mouth daily. (Patient taking differently: Take 10 mg by mouth at bedtime.)   balsalazide (COLAZAL) 750 MG capsule Take 3 capsules (2,250 mg total) by mouth 3 (three) times daily.   Calcium Citrate-Vitamin D (CALCIUM + D PO) Take by mouth at bedtime.   diazepam (VALIUM) 10 MG tablet Take 10 mg by mouth every 6 (six) hours as needed for anxiety.   diclofenac Sodium (VOLTAREN) 1 % GEL Apply 2 g topically 4 (four) times daily. (Patient taking differently: Apply 2 g topically 4 (four) times daily. As needed)   esomeprazole (NEXIUM) 20 MG capsule Take 1 capsule (20 mg total) by mouth daily at 12 noon. (Patient taking differently: Take 20 mg by mouth at bedtime.)   lisinopril (ZESTRIL) 20 MG tablet Take 1 tablet (20 mg total) by mouth daily. (Patient taking differently: Take 40 mg by mouth at bedtime.)   MAGNESIUM PO Take by mouth at bedtime.   Multiple Vitamins-Minerals (MULTIVITAMIN GUMMIES ADULT) CHEW Chew 1 tablet by mouth daily.   traMADol (ULTRAM) 50 MG tablet TAKE 1 TO 2 TABLETS BY MOUTH EVERY 12 HOURS AS NEEDED   No facility-administered encounter medications on file as of 01/19/2022.     REVIEW OF SYSTEMS  : All other systems reviewed and negative except where noted in the History of Present Illness.   PHYSICAL EXAM: BP (!) 140/72   Pulse 67   Ht 5' 3"  (1.6 m)   Wt 177 lb 9.6 oz (80.6 kg)   BMI 31.46 kg/m  General: Well  developed white female in no acute distress Head: Normocephalic and atraumatic Eyes:  Sclerae anicteric, conjunctiva pink. Ears: Normal auditory acuity Lungs: Clear throughout to auscultation; no W/R/R. Heart: Regular rate and rhythm; no M/R/G. Abdomen: Soft, non-distended.  BS present.  Non-tender. Musculoskeletal: Symmetrical with no gross deformities  Skin: No lesions on visible extremities Extremities: No edema  Neurological: Alert oriented x 4, grossly non-focal Psychological:  Alert and cooperative. Normal mood and affect  ASSESSMENT AND PLAN: *Crohn's colitis: Well-controlled on colazal 3 pills 3 times daily.  She needs that refilled.  Prescription sent to pharmacy.  Will be having labs by her PCP in the near future and she will see if they can send [place] those results as well. *GERD/EOE: Well-controlled on Nexium 20 mg daily.  She would like to see what the cost is with prescription.  That was sent to her pharmacy.  **Will follow-up in a year for further refills or sooner if needed.   CC:  No ref. provider found

## 2022-01-20 NOTE — Progress Notes (Signed)
Attending Physician's Attestation   I have reviewed the chart.   I agree with the Advanced Practitioner's note, impression, and recommendations with any updates as below. I have reviewed Dr. Ardis Hughs previous notes as well as PA Zehr's note today.  As she is doing well we will plan to make no significant changes but I do recommend that on follow-up next year that we consider updated colonoscopy as it will be 3 years from her last procedure as well as an updated endoscopy as it will be 9 years since her last endoscopy just to ensure nothing else is developed even in the setting of well-controlled GERD symptoms because of her previous eosinophilic esophagitis findings.  Earlier endoscopic reevaluation can be considered if symptoms change before follow-up in a year.  Justice Britain, MD [facility] Advanced Endoscopy Office # [phone]

## 2022-04-08 IMAGING — CR DG HIP (WITH OR WITHOUT PELVIS) 2-3V*L*
3 series · 3 of 3 positions shown · non-contrast
Comparison: CT abdomen pelvis dated 03/30/2009.

CLINICAL DATA: 65-year-old female with left hip pain.

EXAM:
DG HIP (WITH OR WITHOUT PELVIS) 2-3V LEFT

[w pelvis upright]
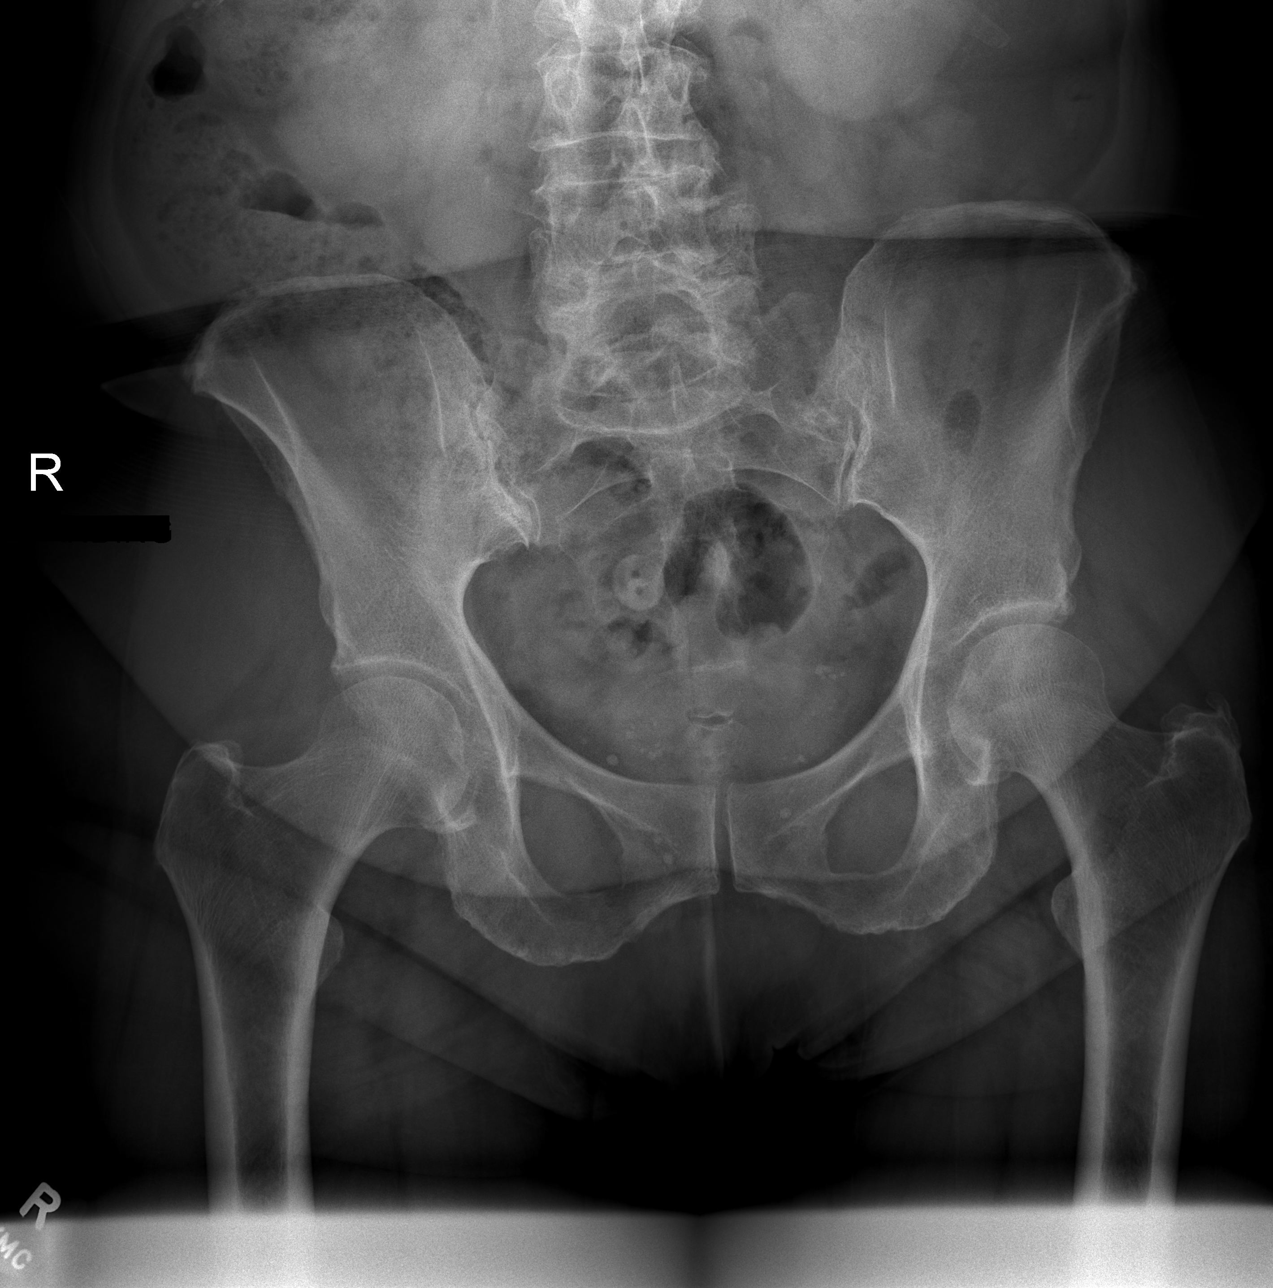

[w hip ap left]
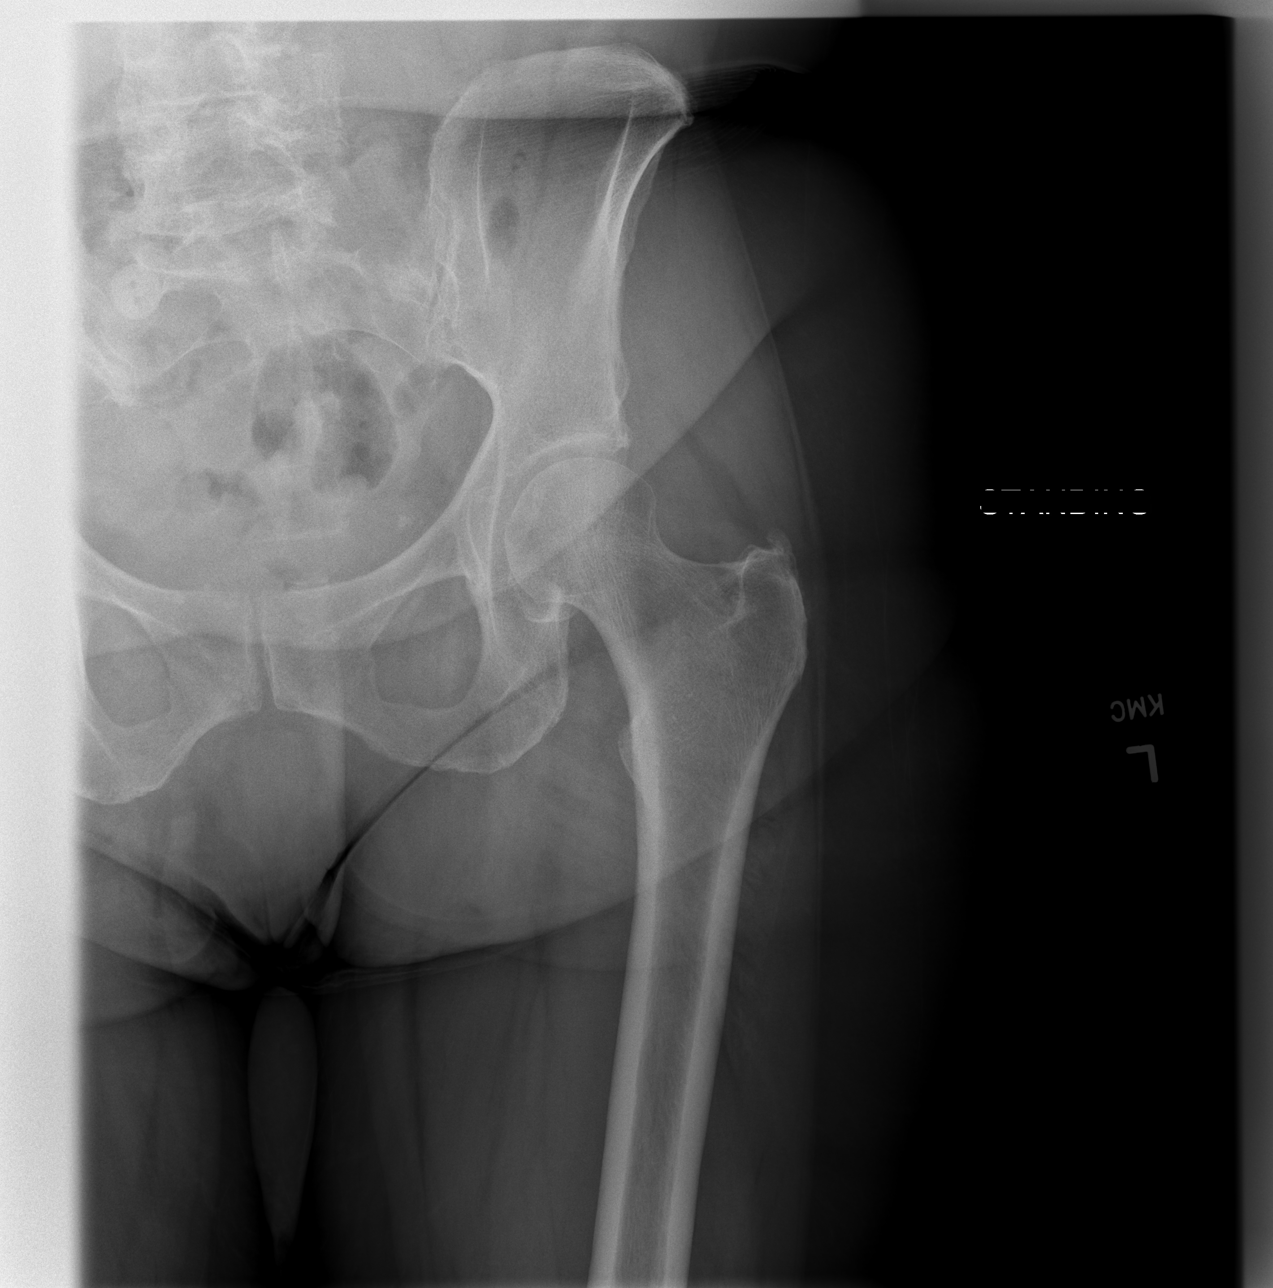

[w hip lat left]
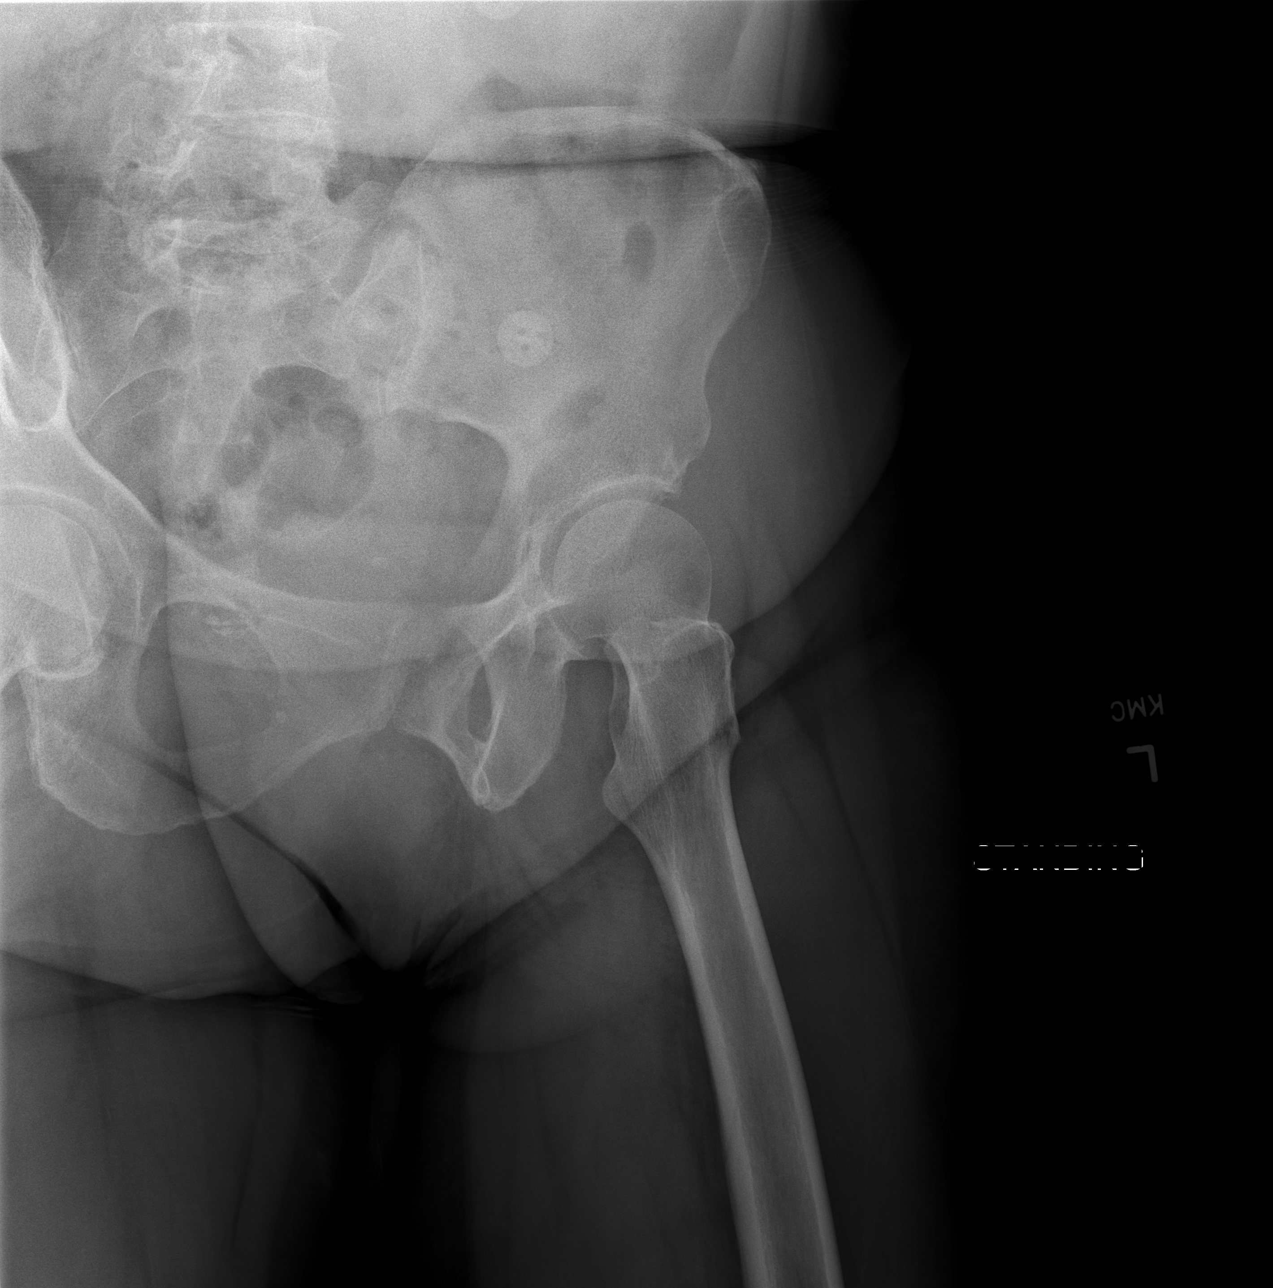

[3 of 3 positions shown; findings below may reference images not displayed]

FINDINGS: There is no acute fracture or dislocation. The bones are mildly
osteopenic. Mild bilateral hip arthritic changes, left greater than
right. Degenerative changes of the lower lumbar spine. No acute
osseous pathology.
IMPRESSION: No acute fracture or dislocation.

## 2022-04-08 IMAGING — CR DG KNEE COMPLETE 4+V*R*
4 series · 4 of 4 positions shown · non-contrast
Comparison: None.

CLINICAL DATA: Chronic right knee pain.

EXAM:
RIGHT KNEE - COMPLETE 4+ VIEW

[w knee ap right]
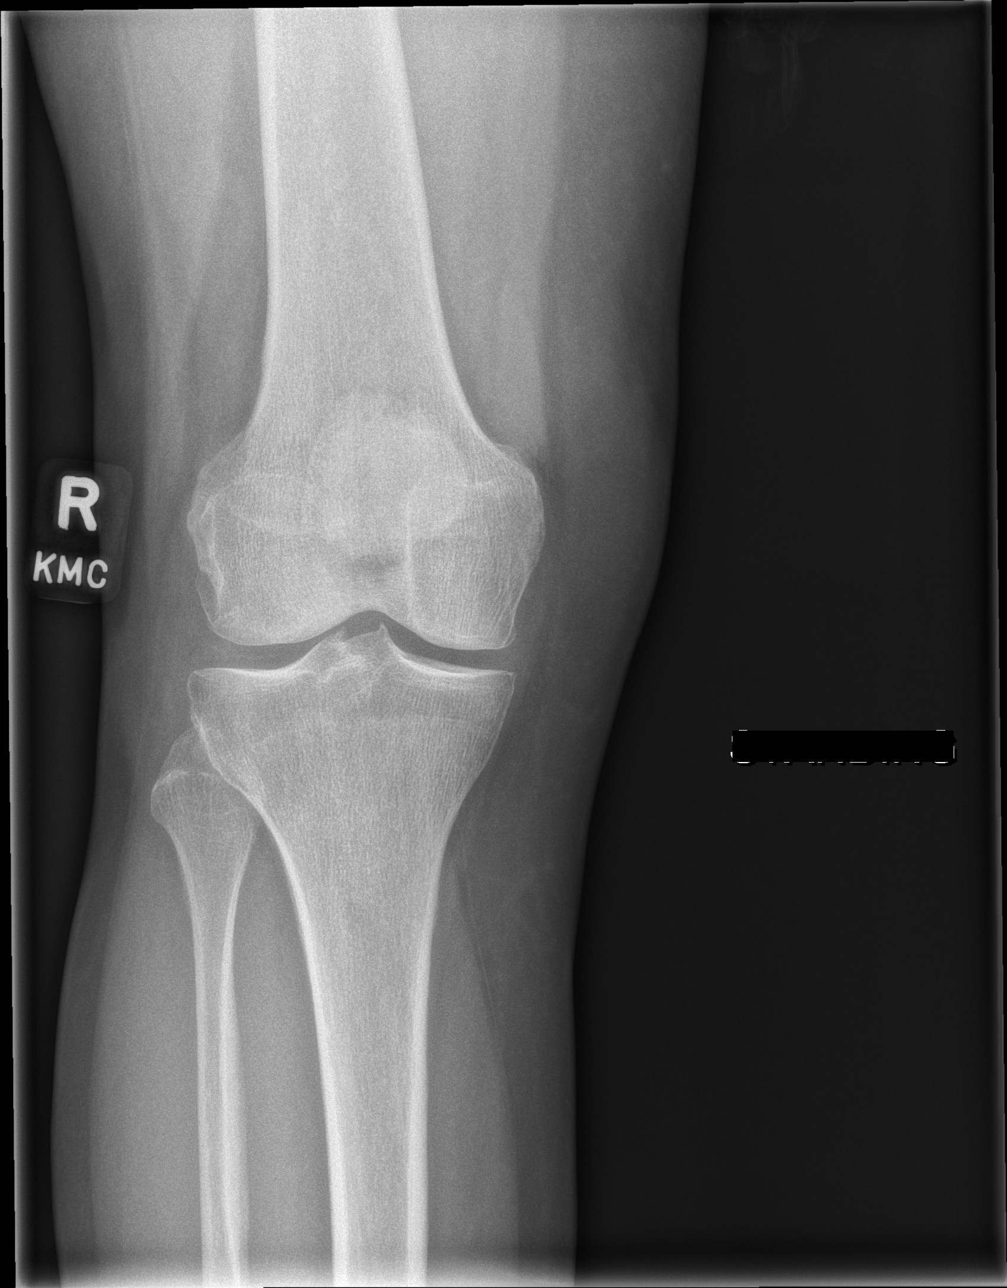

[w knee lat right]
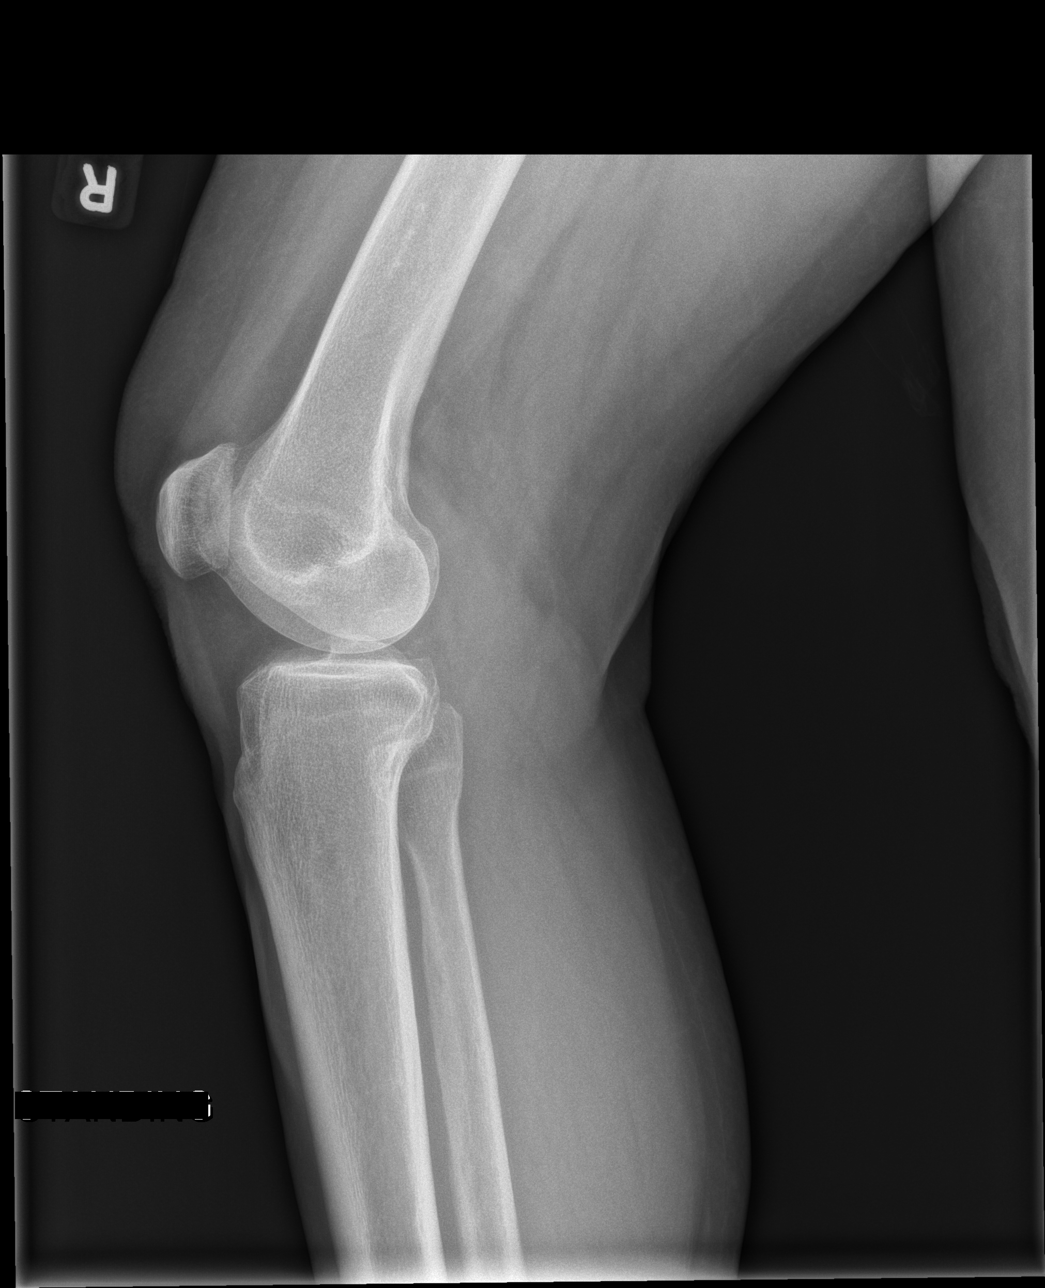

[w knee tunnel pa right]
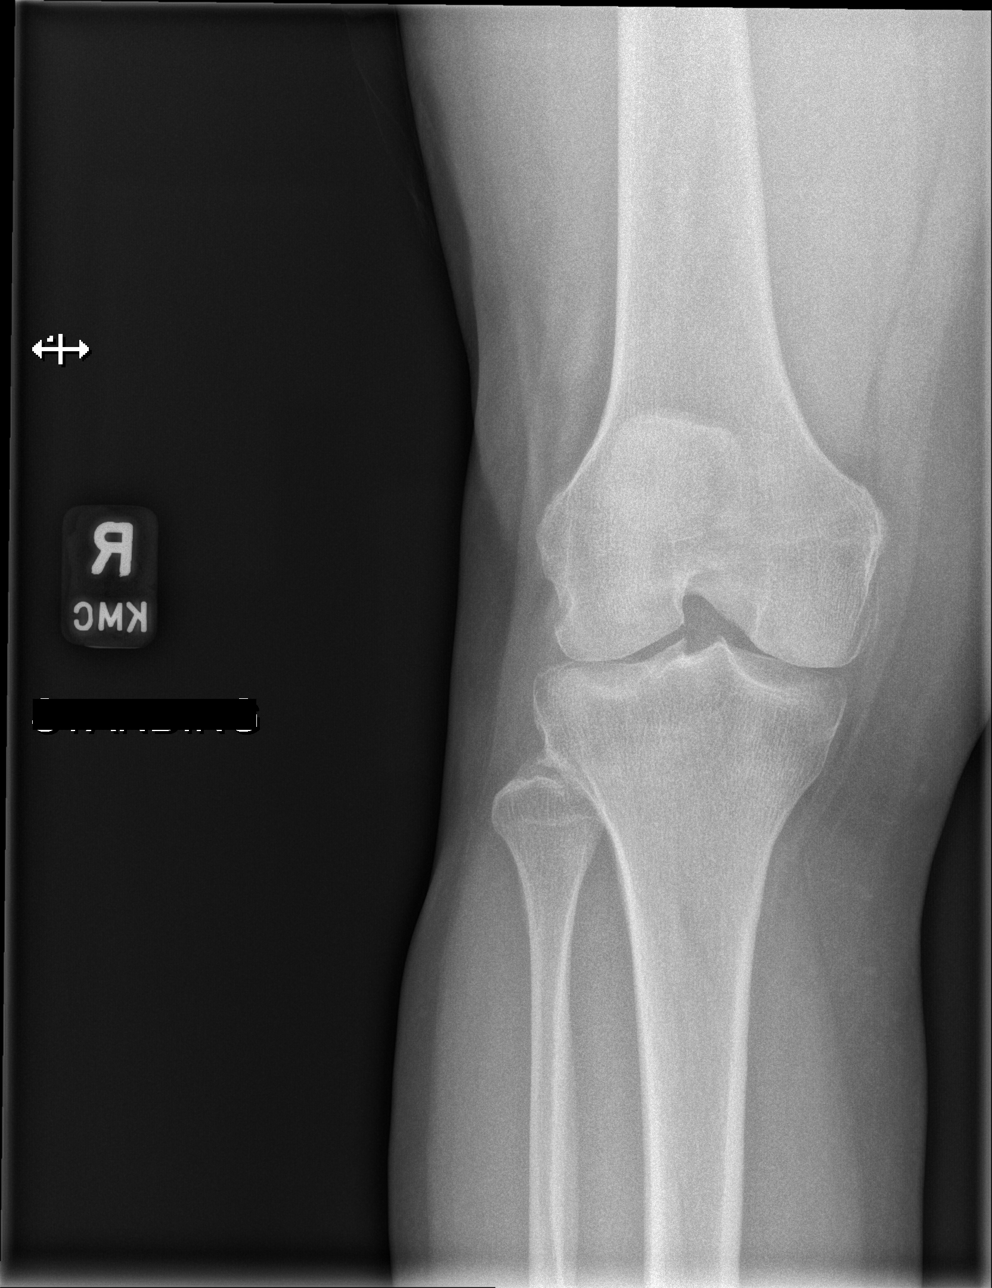

[x knee sunrise right]
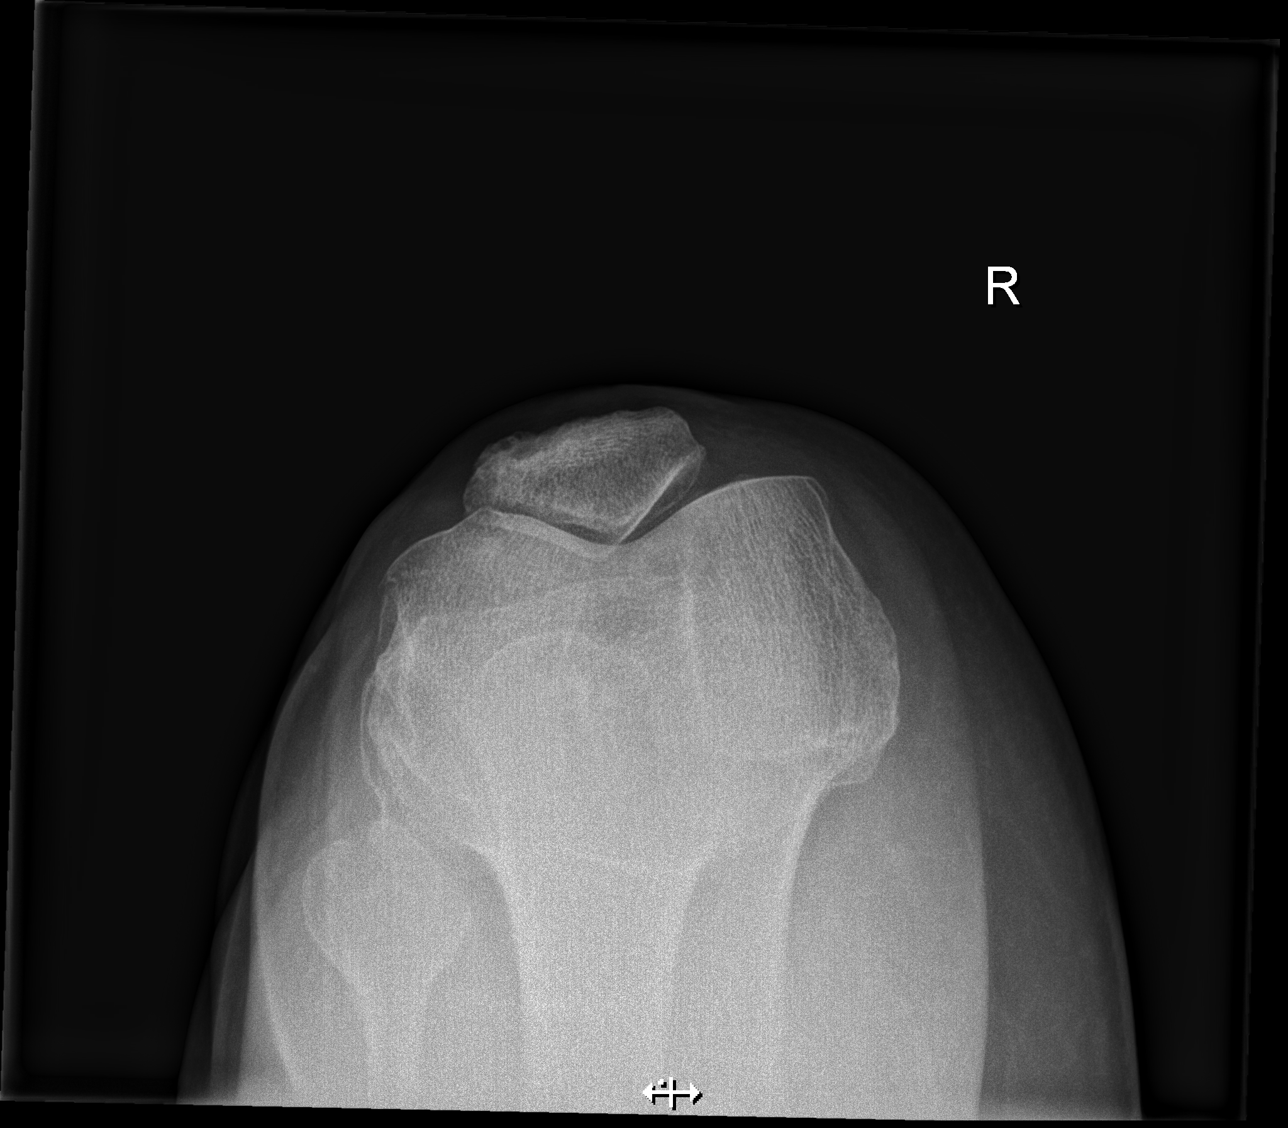

[4 of 4 positions shown; findings below may reference images not displayed]

FINDINGS: No fracture or bone lesion.

Mild narrowing of the lateral aspect of the patellofemoral joint
space. Subtle subchondral irregularity noted along the lateral
patellar facet. There are no other degenerative/arthropathic
changes.

No joint effusion.

Surrounding soft tissues are unremarkable.
IMPRESSION: 1. Subtle findings of patellofemoral arthropathic
change/chondromalacia. This would be better assessed with knee MRI.
2. No other abnormality.

## 2023-01-11 ENCOUNTER — Other Ambulatory Visit: Payer: Self-pay | Admitting: Gastroenterology

## 2023-01-23 ENCOUNTER — Telehealth: Payer: Self-pay | Admitting: Gastroenterology

## 2023-01-23 MED ORDER — BALSALAZIDE DISODIUM 750 MG PO CAPS: 2250.0000 mg | ORAL_CAPSULE | Freq: Three times a day (TID) | ORAL | 3 refills | Status: DC

## 2023-01-23 NOTE — Telephone Encounter (Signed)
Inbound call from patient requesting refill for Balsalazide. States it requires a prior authorization.

## 2023-01-23 NOTE — Telephone Encounter (Signed)
Refill sent to pharmacy. Please submit PA.

## 2023-01-25 ENCOUNTER — Telehealth: Payer: Self-pay

## 2023-01-25 NOTE — Telephone Encounter (Signed)
PA request has been Submitted. New Encounter created for follow up. For additional info see Pharmacy Prior Auth telephone encounter from 01/25/2023.

## 2023-01-25 NOTE — Telephone Encounter (Signed)
Pharmacy Patient Advocate Encounter   Received notification from Pt Calls Messages that prior authorization for Balsalazide 750 mg Capsules is required/requested.   Insurance verification completed.   The patient is insured through [employer]  .   Per test claim: PA required; PA started via CoverMyMeds. KEY [id number] . Waiting for clinical questions to populate.

## 2023-04-14 ENCOUNTER — Encounter: Payer: Self-pay | Admitting: Nurse Practitioner

## 2023-04-14 ENCOUNTER — Ambulatory Visit: Payer: Medicare Other | Admitting: Nurse Practitioner

## 2023-04-14 VITALS — BP 122/74 | HR 74 | Ht 63.0 in | Wt 188.2 lb

## 2023-04-14 DIAGNOSIS — K219 Gastro-esophageal reflux disease without esophagitis: Secondary | ICD-10-CM

## 2023-04-14 DIAGNOSIS — Z8719 Personal history of other diseases of the digestive system: Secondary | ICD-10-CM | POA: Diagnosis not present

## 2023-04-14 DIAGNOSIS — K50919 Crohn's disease, unspecified, with unspecified complications: Secondary | ICD-10-CM

## 2023-04-14 DIAGNOSIS — K5090 Crohn's disease, unspecified, without complications: Secondary | ICD-10-CM | POA: Diagnosis not present

## 2023-04-14 DIAGNOSIS — K200 Eosinophilic esophagitis: Secondary | ICD-10-CM

## 2023-04-14 MED ORDER — ESOMEPRAZOLE MAGNESIUM 20 MG PO CPDR: 20.0000 mg | DELAYED_RELEASE_CAPSULE | Freq: Every day | ORAL | 3 refills | Status: AC

## 2023-04-14 MED ORDER — SUFLAVE 178.7 G PO SOLR: 1.0000 | Freq: Once | ORAL | 0 refills | Status: AC

## 2023-04-14 MED ORDER — BALSALAZIDE DISODIUM 750 MG PO CAPS: 2250.0000 mg | ORAL_CAPSULE | Freq: Three times a day (TID) | ORAL | 11 refills | Status: AC

## 2023-04-14 NOTE — Patient Instructions (Addendum)
 You have been scheduled for an endoscopy and colonoscopy. Please follow the written instructions given to you at your visit today.  If you use inhalers (even only as needed), please bring them with you on the day of your procedure. ____________________________________________ Rosine will receive your bowel preparation through [facility], which ensures the lowest copay and home delivery, with outreach via text or call from an [phone] number. Please respond promptly to avoid rescheduling of your procedure. If you are interested in alternative options or have any questions regarding your prep, please contact them at [phone] ____________________________________________  Your Provider Has Sent Your Bowel Prep Regimen To [facility]   [facility] will contact you to verify your information and collect your copay, if applicable. Enjoy the comfort of your home while your prescription is mailed to you, FREE of any shipping charges.   [facility] accepts all major insurance benefits and applies discounts & coupons.  Have additional questions?   Chat: [email or web address] Call: [phone] Email: [email or web address] NCPDP: [id number]  How will [facility] contact you?  With a Welcome phone call,  a Welcome text and a checkout link in text form.  Texts you receive from [phone] Are NOT Spam.  *To set up delivery, you must complete the checkout process via link or speak to one of the patient care representatives. If [facility] is unable to reach you, your prescription may be delayed.  To avoid long hold times on the phone, you may also utilize the secure chat feature on the [facility] website to request that they call you back for transaction completion or to expedite your concerns.  Follow up with your primary care physician for annual physical to include labs: CBC, CMP, CRP & Vitamin D  level  Due to recent changes in healthcare laws, you may see the results of your imaging and  laboratory studies on MyChart before your provider has had a chance to review them.  We understand that in some cases there may be results that are confusing or concerning to you. Not all laboratory results come back in the same time frame and the provider may be waiting for multiple results in order to interpret others.  Please give us  48 hours in order for your provider to thoroughly review all the results before contacting the office for clarification of your results.   Thank you for trusting me with your gastrointestinal care!   Elida Shawl, CRNP

## 2023-04-14 NOTE — Progress Notes (Signed)
 04/14/2023 Julie Lee [id number] 01-21-1956   Chief Complaint: Crohn's follow up   History of Present Illness: Julie Lee is a 68 year old female with a past medical history of eosinophilic esophagitis and Crohn's colitis initially diagnosed age 24 on Colazal  750mg  3 tabs po tid. Her Crohn's disease is well controlled on Colazal  750mg  three capsules po tid, however, a 90 day supply out of pocket is $200 therefore she quests a 30 day supply which costs $45 per refill. She contacted her insurance carrier and was informed Sulfasalazine is a lower cost alternative, however, she is not wishing to switch medications at this juncture as her Crohn's disease has been stable on Colazal  and she does not want to risk triggering a flare which is absolutely understandable. She intends to apply for a medications assistance program which may reduce the out of pocket cost for Colazal . She is passing a normal formed brown stool once daily. No rectal bleeding, melena  or abdominal pain. Last Crohn's flare was in 2022 treated with a course of Prednisone .  No NSAID use. Her most recent colonoscopy was 07/05/2019 which showed benign lymphoid aggregate at the TI, one 2mm polyp removed from the cecum (path report showed superficial colonic mucosa with changes suggestive of surface of inflammatory bowel) otherwise was unremarkable.   History of eosinophilic esophagitis. No heartburn or dysphagia on Esomeprazole  20mg  once  daily.   Her most recent EGD was 08/09/2013 which identified eosinophilic esophagitis, treated Fluticasone  and PPI.   At the time of her prior office visit 01/19/2022 with Jessica Zehr PA-C, Dr. Wilhelmenia advised an updated colonoscopy in one year as it will be 3 years from her last procedure as well as an updated endoscopy as it will be 9 years since her last endoscopy just to ensure nothing else is developed even in the setting of well-controlled GERD symptoms because of her previous eosinophilic  esophagitis findings.     Latest Ref Rng & Units 08/31/2020   11:40 AM 06/10/2020   12:54 PM 06/20/2019    3:12 PM  CBC  WBC 4.0 - 10.5 K/uL 6.4  5.7  6.8   Hemoglobin 12.0 - 15.0 g/dL 86.5  87.0  87.4   Hematocrit 36.0 - 46.0 % 40.8  39.9  37.8   Platelets 150.0 - 400.0 K/uL 316.0  323  282.0        Latest Ref Rng & Units 08/31/2020   11:40 AM 06/10/2020   12:54 PM 10/28/2019    9:12 AM  CMP  Glucose 70 - 99 mg/dL 83  98  890   BUN 6 - 23 mg/dL 14  20  13    Creatinine 0.40 - 1.20 mg/dL 9.23  9.25  9.14   Sodium 135 - 145 mEq/L 139  138  143   Potassium 3.5 - 5.1 mEq/L 4.4  4.5  4.2   Chloride 96 - 112 mEq/L 105  106  104   CO2 19 - 32 mEq/L 26  25  23    Calcium  8.4 - 10.5 mg/dL 9.7  9.2  89.9   Total Protein 6.0 - 8.3 g/dL 7.3   6.7   Total Bilirubin 0.2 - 1.2 mg/dL 0.3   0.2   Alkaline Phos 39 - 117 U/L 73   77   AST 0 - 37 U/L 18   15   ALT 0 - 35 U/L 16   12     Colonoscopy 07/05/2019: - The examined portion of  the ileum was normal. Biopsied.  - Mild inflammation in the ascending colon. Biopsied.  - One 2 mm polyp in the cecum, removed with a cold snare. Resected and retrieved.  - Normal mucosa in transverse, descending, sigmoid and rectum was normal. Biopsied  - Diverticulosis in the left colon.  - The examination was otherwise normal on direct and retroflexion views 1. Surgical [P], small bowel, terminal ileum - ILEAL MUCOSA WITH BENIGN LYMPHOID AGGREGATE. - NO ACTIVE INFLAMMATION, CHRONIC CHANGES OR GRANULOMAS. 2. Surgical [P], colon, ascending - COLONIC MUCOSA WITH FOCAL HYPEREMIA. - NO ACTIVE INFLAMMATION, CHRONIC CHANGES OR GRANULOMAS. - NEGATIVE FOR DYSPLASIA. 3. Surgical [P], colon, cecum, polyp - SUPERFICIAL COLONIC MUCOSA WITH CHANGES SUGGESTIVE OF SURFACE OF INFLAMMATORY BOWEL. - MILD ACTIVE INFLAMMATION. - FOCAL CHANGES SUGGESTIVE OF CRYPT DISTORTION. - NO DYSPLASIA OR GRANULOMAS. 4. Surgical [P], colon, sigmoid, descending, and transverse - BENIGN COLONIC  MUCOSA. - NO ACTIVE INFLAMMATION, CHRONIC CHANGES OR GRANULOMAS. - NEGATIVE FOR DYSPLASIA.  EGD 08/09/2013: Normal EGD  Colonoscopy 08/09/2013: 1. Normal mucosa in the terminal ileum 2. There was mild erythema, ? inflammation in ascending segment. The examination was otherwise normal. Segmental biopsies taken (4 separate path jars) and sent to pathology.  1. Surgical [P], ascending, biopsy - MINIMALLY ACTIVE CHRONIC COLITIS. - THERE IS NO EVIDENCE OF DYSPLASIA OR MALIGNANCY. - SEE COMMENT. 2. Surgical [P], transverse, biopsy - BENIGN COLONIC MUCOSA. - NO SIGNIFICANT INFLAMMATION OR OTHER ABNORMALITIES IDENTIFIED. 3. Surgical [P], descending, sigmoid, biopsy - BENIGN COLONIC MUCOSA. - NO SIGNIFICANT INFLAMMATION OR OTHER ABNORMALITIES IDENTIFIED. 4. Surgical [P], rectum, biopsy - BENIGN COLONIC MUCOSA. - NO SIGNIFICANT INFLAMMATION OR OTHER ABNORMALITIES IDENTIFIED. 5. Surgical [P], distal esophagus, biopsy - MILDLY INFLAMED SQUAMOUS LINED MUCOSA WITH FOCAL INCREASE IN EOSINOPHILS (UP TO 18 PER HIGH POWER FIELD). - THERE IS NO EVIDENCE OF DYSPLASIA OR MALIGNANCY. - SEE COMMENT. 6. Surgical [P], proximal esophagus, biopsy - MILDLY INFLAMED SQUAMOUS LINE MUCOSA WITH FOCAL INCREASE IN EOSINOPHILS (UP TO 15 PER HIGH POWER FIELD). - THERE IS NO EVIDENCE OF DYSPLASIA OR MALIGNANCY. - SEE COMMENT.  Current Outpatient Medications on File Prior to Visit  Medication Sig Dispense Refill   atenolol  (TENORMIN ) 50 MG tablet Take 1 tablet (50 mg total) by mouth daily. (Patient taking differently: Take 50 mg by mouth at bedtime.) 90 tablet 1   atorvastatin  (LIPITOR) 10 MG tablet Take 1 tablet (10 mg total) by mouth daily. (Patient taking differently: Take 10 mg by mouth at bedtime.) 90 tablet 3   Calcium  Citrate-Vitamin D  (CALCIUM  + D PO) Take by mouth at bedtime.     diazepam  (VALIUM ) 10 MG tablet Take 10 mg by mouth every 6 (six) hours as needed for anxiety.     diclofenac  Sodium (VOLTAREN ) 1  % GEL Apply 2 g topically 4 (four) times daily. (Patient taking differently: Apply 2 g topically 4 (four) times daily. As needed) 100 g 2   lisinopril  (ZESTRIL ) 20 MG tablet Take 1 tablet (20 mg total) by mouth daily. (Patient taking differently: Take 40 mg by mouth at bedtime.) 90 tablet 1   MAGNESIUM  PO Take by mouth at bedtime.     Multiple Vitamins-Minerals (MULTIVITAMIN GUMMIES ADULT) CHEW Chew 1 tablet by mouth daily.     traMADol  (ULTRAM ) 50 MG tablet TAKE 1 TO 2 TABLETS BY MOUTH EVERY 12 HOURS AS NEEDED 90 tablet 5   cyclobenzaprine  (FLEXERIL ) 10 MG tablet Take 10 mg by mouth 3 (three) times daily. (Patient not taking: Reported on 04/14/2023)  gabapentin  (NEURONTIN ) 300 MG capsule Take 300 mg by mouth 3 (three) times daily. (Patient not taking: Reported on 04/14/2023)     No current facility-administered medications on file prior to visit.   No Known Allergies  Current Medications, Allergies, Past Medical History, Past Surgical History, Family History and Social History were reviewed in [employer] record.  Review of Systems:   Constitutional: Negative for fever, sweats, chills or weight loss.  Respiratory: Negative for shortness of breath.   Cardiovascular: Negative for chest pain, palpitations and leg swelling.  Gastrointestinal: See HPI.  Musculoskeletal: Negative for back pain or muscle aches.  Neurological: Negative for dizziness, headaches or paresthesias.   Physical Exam: BP 122/74   Pulse 74   Ht 5' 3 (1.6 m)   Wt 188 lb 4 oz (85.4 kg)   SpO2 97%   BMI 33.35 kg/m  General: 68 year old female  in no acute distress. Head: Normocephalic and atraumatic. Eyes: No scleral icterus. Conjunctiva pink . Ears: Normal auditory acuity. Mouth: Dentition intact. No ulcers or lesions.  Lungs: Clear throughout to auscultation. Heart: Regular rate and rhythm, no murmur. Abdomen: Soft, nontender and nondistended. No masses or hepatomegaly. Normal bowel sounds  x 4 quadrants.  Rectal: Deferred.  Musculoskeletal: Symmetrical with no gross deformities. Extremities: No edema. Neurological: Alert oriented x 4. No focal deficits.  Psychological: Alert and cooperative. Normal mood and affect  Assessment and Recommendations:  68 year old female with ulcerative colitis, stable on Colazal  750mg  three tabs po bid. Her most recent colonoscopy was 07/05/2019 which showed benign lymphoid aggregate at the TI, one 2mm polyp removed from the cecum (path report showed superficial colonic mucosa with changes suggestive of surface of inflammatory bowel) otherwise was unremarkable.  -Refill Colazal  750mg  3 tabs po tid # 270 with 11 additional refills (monthly refill lower out of pocket cost than 90 day refill) -Colonoscopy benefits and risks discussed including risk with sedation, risk of bleeding, perforation and infection  -Patient due for her annual physical 06/2023 including routine labs, request CBC, CMP, CRP and vitamin D  to be done at that time  GERD and Eosinophilic esophagitis, stable on Esomeprazole  20mg  every day. EGD 08/09/2013 identified eosinophilic esophagitis, treated Fluticasone  and PPI.  -EGD to reassess for eosinophilic esophagitis (see HPI),  benefits and risks discussed including risk with sedation, risk of bleeding, perforation and infection  -GERD diet -Continue Esomeprazole  20mg  every day  Further recommendations to be determined after the above evaluation completed

## 2023-04-16 NOTE — Progress Notes (Signed)
 Attending Physician's Attestation   I have reviewed the chart.   I agree with the Advanced Practitioner's note, impression, and recommendations with any updates as below.    Corliss Parish, MD [facility] Advanced Endoscopy Office # [phone]

## 2023-04-17 ENCOUNTER — Telehealth: Payer: Self-pay

## 2023-04-17 NOTE — Telephone Encounter (Signed)
 Dr. Rachael Budd added to chart as Pt PCP. Recent office note faxed.

## 2023-04-17 NOTE — Telephone Encounter (Signed)
 Message Received: 2 days ago Tory Freiberg, NP  Mansouraty, Albino Alu., MD; Cristobal Donning, RN Landon Pinion, pls fax this office visit to patient's new PCP Dr. Rachael Budd at [facility]. Please enter PCP in Epic chart. THX.

## 2023-04-27 ENCOUNTER — Telehealth: Payer: Self-pay | Admitting: Nurse Practitioner

## 2023-04-27 ENCOUNTER — Telehealth: Payer: Self-pay | Admitting: Pharmacy Technician

## 2023-04-27 NOTE — Telephone Encounter (Signed)
Patient called again and stated that her insurance company [employer] will possible be faxing over a form today. Patient is requesting that we fill it out in a timely manner so that she can offered to get her COLAZAL. Please advise.

## 2023-04-27 NOTE — Telephone Encounter (Signed)
Inbound call from [facility] stating that had received medication request for  Colazal. They advised you can call [phone] opt [phone] to discuss and they are going to send a fax form  that can be filled out and faxed back. Please advise.

## 2023-04-27 NOTE — Telephone Encounter (Signed)
Pharmacy Patient Advocate Encounter   Received notification from Physician's Office that prior authorization for BALSALAZIDE 750MG  is required/requested.   Insurance verification completed.   The patient is insured through [facility] .   Per test claim: PA required; PA submitted to above mentioned insurance via Fax Key/confirmation #/EOC [id number] Status is pending

## 2023-06-09 ENCOUNTER — Encounter: Payer: Medicare Other | Admitting: Gastroenterology
